# Patient Record
Sex: Female | Born: 2000 | Race: White | Hispanic: No | Marital: Single | Smoking: Former smoker
Health system: Southern US, Community
[De-identification: ages and names within clinical notes are randomized; demographics above are authoritative.]

## PROBLEM LIST (undated history)

## (undated) HISTORY — PX: OTHER SURGICAL HISTORY: SHX169

---
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
## 2020-01-05 ENCOUNTER — Other Ambulatory Visit: Payer: Self-pay

## 2020-01-05 ENCOUNTER — Ambulatory Visit: Payer: Self-pay

## 2020-01-05 ENCOUNTER — Emergency Department
Admission: EM | Admit: 2020-01-05 | Discharge: 2020-01-05 | Disposition: A | Discharge status: Home / Self Care | Payer: Medicaid Other | Attending: Student | Admitting: Student

## 2020-01-05 DIAGNOSIS — Y999 Unspecified external cause status: Secondary | ICD-10-CM | POA: Diagnosis not present

## 2020-01-05 DIAGNOSIS — Z79899 Other long term (current) drug therapy: Secondary | ICD-10-CM | POA: Diagnosis not present

## 2020-01-05 DIAGNOSIS — Y9389 Activity, other specified: Secondary | ICD-10-CM | POA: Insufficient documentation

## 2020-01-05 DIAGNOSIS — S0591XA Unspecified injury of right eye and orbit, initial encounter: Secondary | ICD-10-CM | POA: Diagnosis present

## 2020-01-05 DIAGNOSIS — Y929 Unspecified place or not applicable: Secondary | ICD-10-CM | POA: Insufficient documentation

## 2020-01-05 DIAGNOSIS — H11421 Conjunctival edema, right eye: Secondary | ICD-10-CM | POA: Insufficient documentation

## 2020-01-05 DIAGNOSIS — H1131 Conjunctival hemorrhage, right eye: Secondary | ICD-10-CM | POA: Diagnosis not present

## 2020-01-05 DIAGNOSIS — S0501XA Injury of conjunctiva and corneal abrasion without foreign body, right eye, initial encounter: Secondary | ICD-10-CM

## 2020-01-05 DIAGNOSIS — X58XXXA Exposure to other specified factors, initial encounter: Secondary | ICD-10-CM | POA: Diagnosis not present

## 2020-01-05 MED ORDER — CIPROFLOXACIN HCL 0.3 % OP SOLN: 1.0000 [drp] | OPHTHALMIC | 0 refills | Status: DC

## 2020-01-05 MED ORDER — TETRACAINE HCL 0.5 % OP SOLN
2.0000 [drp] | Freq: Once | OPHTHALMIC | Status: DC
  Filled 2020-01-05: qty 4

## 2020-01-05 MED ORDER — KETOROLAC TROMETHAMINE 0.5 % OP SOLN: 1.0000 [drp] | Freq: Four times a day (QID) | OPHTHALMIC | 0 refills | Status: DC | PRN

## 2020-01-05 MED ORDER — FLUORESCEIN SODIUM 1 MG OP STRP
1.0000 | ORAL_STRIP | Freq: Once | OPHTHALMIC | Status: DC
  Filled 2020-01-05: qty 1

## 2020-01-05 NOTE — ED Triage Notes (Signed)
Patient to ED for complaint of right eye pain. Has worn nonRx contact lenses for 7 months only taking them out a few times. The contact lenses were for looks only not for vision. Patient has no complaint of vision changes but states she feels like her eyes are swollen. Wants to make sure she doesn't have an infection. No obvious redness or swelling noted. Shows a video of eye redness.

## 2020-01-05 NOTE — ED Provider Notes (Signed)
[facility] Emergency Department Provider Note  ____________________________________________  Time seen: Approximately 8:02 PM  I have reviewed the triage vital signs and the nursing notes.   HISTORY  Chief Complaint Eye Pain (Wore contacts for 7 months)    HPI Karen Lucero is a 19 y.o. female who presents the emergency department complaining of right eye pain, redness, possible "swollen" area to the superior aspect of the eyeball.  Patient denies any visual changes or drainage.  Patient states that she was wearing color changing contact lenses.  These were nonprescription.  She states that she wore them almost constantly for several months without removing.  While wearing them she started to have pain to the superior aspect of the eye.  When she remove them she noticed what looked like ruptured blood vessels as well as a "swollen" area to the superior aspect of the eyeball.  No drainage from same.  Patient denies any vision changes.  No other complaints at this time.  Patient does not wear glasses.  She does not need corrective lenses.  Patient was wearing contact lenses to change the color of her eyes.         History reviewed. No pertinent past medical history.  There are no problems to display for this patient.   History reviewed. No pertinent surgical history.  Prior to Admission medications   Medication Sig Start Date End Date Taking? Authorizing Provider  ciprofloxacin (CILOXAN) 0.3 % ophthalmic solution Place 1 drop into the right eye every 2 (two) hours. Administer 1 drop, every 2 hours, while awake, for 2 days. Then 1 drop, every 4 hours, while awake, for the next 5 days. 01/05/20   Navid Lenzen, Charline Bills, PA-C  etonogestrel (NEXPLANON) 68 MG IMPL implant 1 each by Subdermal route once. 11/03/17   Sciora, Real Cons, CNM  ketorolac (ACULAR) 0.5 % ophthalmic solution Place 1 drop into the right eye 4 (four) times daily as needed (eye pain). 01/05/20    Karolina Zamor, Charline Bills, PA-C    Allergies Patient has no known allergies.  Family History  Problem Relation Age of Onset  . Depression Mother   . Autism Sister   . Arthritis Maternal Grandmother   . Depression Maternal Grandmother   . Diabetes Maternal Grandmother   . Hypercholesterolemia Maternal Grandmother     Social History Social History   Tobacco Use  . Smoking status: Never Smoker  . Smokeless tobacco: Never Used  Substance Use Topics  . Alcohol use: Not Currently  . Drug use: Never     Review of Systems  Constitutional: No fever/chills Eyes: No visual changes. No discharge.  Pain, redness, possible swollen area to the right upper eye.  Or contact for 7 months without removing.  Nonprescription contacts ENT: No upper respiratory complaints. Cardiovascular: no chest pain. Respiratory: no cough. No SOB. Gastrointestinal: No abdominal pain.  No nausea, no vomiting.  No diarrhea.  No constipation. Musculoskeletal: Negative for musculoskeletal pain. Skin: Negative for rash, abrasions, lacerations, ecchymosis. Neurological: Negative for headaches, focal weakness or numbness. 10-point ROS otherwise negative.  ____________________________________________   PHYSICAL EXAM:  VITAL SIGNS: ED Triage Vitals  Enc Vitals Group     BP 01/05/20 1946 113/68     Pulse Rate 01/05/20 1946 80     Resp 01/05/20 1946 18     Temp 01/05/20 1946 98.5 F (36.9 C)     Temp Source 01/05/20 1946 Oral     SpO2 01/05/20 1946 99 %  Weight 01/05/20 1948 110 lb (49.9 kg)     Height 01/05/20 1948 5\' 3"  (1.6 m)     Head Circumference --      Peak Flow --      Pain Score 01/05/20 1947 5     Pain Loc --      Pain Edu? --      Excl. in [place]? --      Constitutional: Alert and oriented. Well appearing and in no acute distress. Eyes: Conjunctivae reveals erythema to the superior aspect in the 12 o'clock position on the right eye.  No other conjunctival findings.  Patient does have some  mild subconjunctival hemorrhage in the area of erythema.  No visible foreign body.  PERRL. EOMI. funduscopic exam reveals no visible foreign body.  Red reflex present bilaterally.  Vasculature and optic disc is unremarkable bilaterally.  Eye is anesthetized using tetracaine and fluorescein staining applied.  Area of uptake noted in the 12 o'clock position.  No dendritic pattern is identified. Head: Atraumatic. ENT:      Ears:       Nose: No congestion/rhinnorhea.      Mouth/Throat: Mucous membranes are moist.  Neck: No stridor.    Cardiovascular: Normal rate, regular rhythm. Normal S1 and S2.  Good peripheral circulation. Respiratory: Normal respiratory effort without tachypnea or retractions. Lungs CTAB. Good air entry to the bases with no decreased or absent breath sounds. Musculoskeletal: Full range of motion to all extremities. No gross deformities appreciated. Neurologic:  Normal speech and language. No gross focal neurologic deficits are appreciated.  Skin:  Skin is warm, dry and intact. No rash noted. Psychiatric: Mood and affect are normal. Speech and behavior are normal. Patient exhibits appropriate insight and judgement.   ____________________________________________   LABS (all labs ordered are listed, but only abnormal results are displayed)  Labs Reviewed - No data to display ____________________________________________  EKG   ____________________________________________  RADIOLOGY   No results found.  ____________________________________________    PROCEDURES  Procedure(s) performed:    Procedures    Medications  fluorescein ophthalmic strip 1 strip (has no administration in time range)  tetracaine (PONTOCAINE) 0.5 % ophthalmic solution 2 drop (has no administration in time range)     ____________________________________________   INITIAL IMPRESSION / ASSESSMENT AND PLAN / ED COURSE  Pertinent labs & imaging results that were available during my  care of the patient were reviewed by me and considered in my medical decision making (see chart for details).  Review of the [place] CSRS was performed in accordance of the [facility] prior to dispensing any controlled drugs.           Patient's diagnosis is consistent with corneal abrasion, chemosis, subconjunctival hemorrhage.  Patient presented to the emergency department complaining of right eye pain, redness, possible swelling after using nonprescription contacts.  Patient reports that she was using color change in contacts for aesthetic purposes.  She left the contact lenses and almost consistently for 7 months.  Findings are concerning for corneal abrasion/corneal ulcer with surrounding chemosis and subconjunctival hemorrhage.  I suspect that these were most likely sustained while removing the contacts after leaving them in for too long.  No dendritic pattern.  Given the fact that this occurred after contact where I will place the patient on Cipro eyedrops.  No visual changes at this time.  No indication for emergent ophthalmology referral.  Patient was placed on Cipro eyedrops, recommended to follow-up with ophthalmology..  Patient should not wear contact until  finishing antibiotics.  Patient should discard current contact lenses.  Patient is given ED precautions to return to the ED for any worsening or new symptoms.     ____________________________________________  FINAL CLINICAL IMPRESSION(S) / ED DIAGNOSES  Final diagnoses:  Abrasion of right cornea, initial encounter  Chemosis of right conjunctiva  Subconjunctival hemorrhage of right eye      NEW MEDICATIONS STARTED DURING THIS VISIT:  ED Discharge Orders         Ordered    ciprofloxacin (CILOXAN) 0.3 % ophthalmic solution  Every 2 hours     01/05/20 2029    ketorolac (ACULAR) 0.5 % ophthalmic solution  4 times daily PRN     01/05/20 2029              This chart was dictated using voice recognition software/Dragon. Despite  best efforts to proofread, errors can occur which can change the meaning. Any change was purely unintentional.    Racheal Patches, PA-C 01/05/20 2030    Miguel Aschoff., MD 01/06/20 1227

## 2020-01-08 ENCOUNTER — Emergency Department
Admission: EM | Admit: 2020-01-08 | Discharge: 2020-01-08 | Disposition: A | Discharge status: Home / Self Care | Payer: Medicaid Other | Attending: Student | Admitting: Student

## 2020-01-08 ENCOUNTER — Other Ambulatory Visit: Payer: Self-pay

## 2020-01-08 ENCOUNTER — Ambulatory Visit: Payer: Self-pay

## 2020-01-08 ENCOUNTER — Encounter: Payer: Self-pay | Admitting: Emergency Medicine

## 2020-01-08 DIAGNOSIS — H02826 Cysts of left eye, unspecified eyelid: Secondary | ICD-10-CM

## 2020-01-08 DIAGNOSIS — L723 Sebaceous cyst: Secondary | ICD-10-CM | POA: Diagnosis not present

## 2020-01-08 DIAGNOSIS — Z793 Long term (current) use of hormonal contraceptives: Secondary | ICD-10-CM | POA: Diagnosis not present

## 2020-01-08 DIAGNOSIS — H02824 Cysts of left upper eyelid: Secondary | ICD-10-CM | POA: Diagnosis not present

## 2020-01-08 DIAGNOSIS — H02844 Edema of left upper eyelid: Secondary | ICD-10-CM | POA: Diagnosis present

## 2020-01-08 NOTE — Discharge Instructions (Addendum)
Continue previous medication and follow discharge care instruction.  If redness swelling increased follow-up with ophthalmology.

## 2020-01-08 NOTE — Telephone Encounter (Signed)
Returned call to patient who states that she was being treated via an ER visit for a blue blister rt eye.  She states that now her left eye is swollen and itchy. She states that she notices a bump left side of her face that between eye and ear She states the under eye is swollen and pink in color. She denies fever and states rt eye has improved. Per protocol patient is going to ER for evaluation of her left eye.  She states that her mother is already in route to take her to the ER. Care advice was read to patient She verbalized understanding.  Reason for Disposition . [1] Eyelid (outer) is very red AND [2] fever  Answer Assessment - Initial Assessment Questions 1. ONSET: "When did the swelling start?" (e.g., minutes, hours, days)     Left eye 2. LOCATION: "What part of the eyelids is swollen?"     Upper and lower 3. SEVERITY: "How swollen is it?"    Partial shut 4. ITCHING: "Is there any itching?" If so, ask: "How much?"   (Scale 1-10; mild, moderate or severe)     7 5. PAIN: "Is the swelling painful to touch?" If so, ask: "How painful is it?"   (Scale 1-10; mild, moderate or severe)    no 6. FEVER: "Do you have a fever?" If so, ask: "What is it, how was it measured, and when did it start?"      no 7. CAUSE: "What do you think is causing the swelling?"   unsure 8. RECURRENT SYMPTOM: "Have you had eyelid swelling before?" If so, ask: "When was the last time?" "What happened that time?"     no 9. OTHER SYMPTOMS: "Do you have any other symptoms?" (e.g., blurred vision, eye discharge, rash, runny nose)   Bump left side of face between 10. PREGNANCY: "Is there any chance you are pregnant?" "When was your last menstrual period?"       No birth control no regular periods  Protocols used: EYE - [facility]

## 2020-01-08 NOTE — ED Provider Notes (Signed)
[facility] Emergency Department Provider Note   ____________________________________________   First MD Initiated Contact with Patient 01/08/20 1430     (approximate)  I have reviewed the triage vital signs and the nursing notes.   HISTORY  Chief Complaint Eye Problem    HPI Karen Lucero is a 19 y.o. female patient presents with left upper eyelid edema with full papular lesion.  Patient also has a papular lesion on erythematous base left lateral supraorbital area.  Patient denies vision disturbance.  Patient was seen 3 days ago and diagnosed with corneal abrasion subconjunctival hemorrhaging secondary to prolonged use of cosmetic contact lenses.  Patient is currently being treated with eyedrops.  Patient states right eye pain has improved.  Patient denies left eye pain.  No palliative measure for complaint.         History reviewed. No pertinent past medical history.  There are no problems to display for this patient.   History reviewed. No pertinent surgical history.  Prior to Admission medications   Medication Sig Start Date End Date Taking? Authorizing Provider  ciprofloxacin (CILOXAN) 0.3 % ophthalmic solution Place 1 drop into the right eye every 2 (two) hours. Administer 1 drop, every 2 hours, while awake, for 2 days. Then 1 drop, every 4 hours, while awake, for the next 5 days. 01/05/20   Cuthriell, Charline Bills, PA-C  etonogestrel (NEXPLANON) 68 MG IMPL implant 1 each by Subdermal route once. 11/03/17   Sciora, Real Cons, CNM  ketorolac (ACULAR) 0.5 % ophthalmic solution Place 1 drop into the right eye 4 (four) times daily as needed (eye pain). 01/05/20   Cuthriell, Charline Bills, PA-C    Allergies Patient has no known allergies.  Family History  Problem Relation Age of Onset  . Depression Mother   . Autism Sister   . Arthritis Maternal Grandmother   . Depression Maternal Grandmother   . Diabetes Maternal Grandmother   .  Hypercholesterolemia Maternal Grandmother     Social History Social History   Tobacco Use  . Smoking status: Never Smoker  . Smokeless tobacco: Never Used  Substance Use Topics  . Alcohol use: Not Currently  . Drug use: Never    Review of Systems Constitutional: No fever/chills Eyes: No visual changes. ENT: No sore throat. Cardiovascular: Denies chest pain. Respiratory: Denies shortness of breath. Gastrointestinal: No abdominal pain.  No nausea, no vomiting.  No diarrhea.  No constipation. Genitourinary: Negative for dysuria. Musculoskeletal: Negative for back pain. Skin: Papular lesion left upper eyelid and left supraorbital area. Neurological: Negative for headaches, focal weakness or numbness.   ____________________________________________   PHYSICAL EXAM:  VITAL SIGNS: ED Triage Vitals  Enc Vitals Group     BP 01/08/20 1336 114/75     Pulse Rate 01/08/20 1336 87     Resp 01/08/20 1336 18     Temp 01/08/20 1336 98 F (36.7 C)     Temp Source 01/08/20 1336 Oral     SpO2 01/08/20 1336 99 %     Weight 01/08/20 1339 107 lb (48.5 kg)     Height 01/08/20 1339 5\' 3"  (1.6 m)     Head Circumference --      Peak Flow --      Pain Score 01/08/20 1339 0     Pain Loc --      Pain Edu? --      Excl. in [place]? --    Constitutional: Alert and oriented. Well appearing and in no acute distress.  Eyes: Conjunctivae are normal. PERRL. EOMI. Cardiovascular: Normal rate, regular rhythm. Grossly normal heart sounds.  Good peripheral circulation. Respiratory: Normal respiratory effort.  No retractions. Lungs CTAB. Neurologic:  Normal speech and language. No gross focal neurologic deficits are appreciated. No gait instability. Skin:  Skin is warm, dry and intact.  Papular lesion on erythematous left upper eyelid. Psychiatric: Mood and affect are normal. Speech and behavior are normal.  ____________________________________________   LABS (all labs ordered are listed, but only  abnormal results are displayed)  Labs Reviewed - No data to display ____________________________________________  EKG   ____________________________________________  RADIOLOGY  ED MD interpretation:    Official radiology report(s): No results found.  ____________________________________________   PROCEDURES  Procedure(s) performed (including Critical Care):  Procedures   ____________________________________________   INITIAL IMPRESSION / ASSESSMENT AND PLAN / ED COURSE  As part of my medical decision making, I reviewed the following data within the electronic MEDICAL RECORD NUMBER     Patient presents with mild edema and erythema to the upper eyelid.  Papular lesion consistent with sebaceous cyst on the left upper eyelid.  Patient given discharge care instruction advised continue previous medication.  Patient advised follow ophthalmology if vision disturbance develop.  Follow discharge care instructions.    Nattaly Yebra was evaluated in Emergency Department on 01/08/2020 for the symptoms described in the history of present illness. She was evaluated in the context of the global COVID-19 pandemic, which necessitated consideration that the patient might be at risk for infection with the SARS-CoV-2 virus that causes COVID-19. Institutional protocols and algorithms that pertain to the evaluation of patients at risk for COVID-19 are in a state of rapid change based on information released by regulatory bodies including the CDC and federal and state organizations. These policies and algorithms were followed during the patient's care in the ED.       ____________________________________________   FINAL CLINICAL IMPRESSION(S) / ED DIAGNOSES  Final diagnoses:  Sebaceous cyst of eyelid, left  Inflamed sebaceous cyst     ED Discharge Orders    None       Note:  This document was prepared using Dragon voice recognition software and may include unintentional dictation  errors.    Joni Reining, PA-C 01/08/20 1455    Miguel Aschoff., MD 01/08/20 [phone]

## 2020-01-08 NOTE — ED Triage Notes (Signed)
Patient presents to the ED with pain, swelling and redness to left eye and around left eye.  Patient had redness to her right eye recently and was seen for that but now is having problems with left.  Patient has a small red bump to the left of her left eye as well.

## 2020-01-08 NOTE — ED Notes (Signed)
See triage note  States she was seen about 3 days ago for right eye  States that eye is clearing up  But now has swelling around left eye

## 2020-01-15 ENCOUNTER — Ambulatory Visit: Payer: Self-pay

## 2020-02-02 ENCOUNTER — Telehealth: Payer: Self-pay | Admitting: General Practice

## 2020-02-02 NOTE — Telephone Encounter (Signed)
Returned patient call. Patient states she has cramping, but no bleeding, each month when her period "is due". Sometimes relieved with ibuprofen, but sometimes ibuprofen doesn't help and patient states she is "doubled over in pain". She also states she has lost some weight since Nexplanon insertion, didn't state how much. Nexplanon inserted 11/03/17 at [facility], Last PE was 06/2017. Patient interested in switching to Depo. Patient counseled that both Depo and Nexplanon contain Progesterone, and there is no guarantee that she will gain weight with Depo, or that she will have no cramps. Patient counseled that she can expect 3-6 weeks of irregular bleeding with Depo, and that her Nexplanon is good until December this year. Per provider, patient counseled that she would recommend STD testing due to patient's complaints of cramping/abdominal pain. Per Geralyn Flash, ok to schedule patient for 40 minutes to address issues. Patient scheduled for 02/08/20 and counseled no sex until after appointment (last sex was "a week ago, no condom"). Patient states understanding, and agrees with plan.Burt Knack, RN

## 2020-02-02 NOTE — Telephone Encounter (Signed)
looking to remove nexplanon, consult other options

## 2020-02-08 ENCOUNTER — Other Ambulatory Visit: Payer: Self-pay

## 2020-02-08 ENCOUNTER — Ambulatory Visit: Payer: Medicaid Other | Admitting: Family Medicine

## 2020-02-08 ENCOUNTER — Encounter: Payer: Self-pay | Admitting: Family Medicine

## 2020-02-08 ENCOUNTER — Ambulatory Visit: Payer: Self-pay

## 2020-02-08 ENCOUNTER — Encounter: Payer: Medicaid Other | Admitting: Family Medicine

## 2020-02-08 DIAGNOSIS — Z975 Presence of (intrauterine) contraceptive device: Secondary | ICD-10-CM | POA: Insufficient documentation

## 2020-02-08 DIAGNOSIS — Z113 Encounter for screening for infections with a predominantly sexual mode of transmission: Secondary | ICD-10-CM

## 2020-02-08 LAB — WET PREP FOR TRICH, YEAST, CLUE
Trichomonas Exam: NEGATIVE
Yeast Exam: NEGATIVE

## 2020-02-08 LAB — HM HIV SCREENING LAB: HM HIV Screening: NEGATIVE

## 2020-02-08 NOTE — Progress Notes (Signed)
In desiring STD screening; declines HIV/RPR Sharlette Dense, RN

## 2020-02-08 NOTE — Progress Notes (Addendum)
Wet mount results reviewed. Per standing orders no treatment indicated. ROI signed by patient secondary to requesting TR from today's visit. [employer] results copy given. Tawny Hopping, RN

## 2020-02-08 NOTE — Progress Notes (Signed)
[facility] Department STI clinic/screening visit  Subjective:  Karen Lucero is a 19 y.o. female being seen today for an STI screening visit. The patient reports they do not have symptoms.  Patient reports that they do not desire a pregnancy in the next year.   They reported they are not interested in discussing contraception today.  No LMP recorded. Patient has had an implant.   Patient has the following medical conditions:   Patient Active Problem List   Diagnosis Date Noted  . Nexplanon in place 02/08/2020    Chief Complaint  Patient presents with  . SEXUALLY TRANSMITTED DISEASE    HPI  Patient reports No sx today. Just wants to "make sure everything is ok". Has nto been told by partner about any exposures.   See flowsheet for further details and programmatic requirements.    The following portions of the patient's history were reviewed and updated as appropriate: allergies, current medications, past medical history, past social history, past surgical history and problem list.  Objective:  There were no vitals filed for this visit.  Physical Exam Vitals and nursing note reviewed.  Constitutional:      Appearance: Normal appearance.  HENT:     Head: Normocephalic and atraumatic.     Mouth/Throat:     Mouth: Mucous membranes are moist.     Pharynx: Oropharynx is clear. No oropharyngeal exudate or posterior oropharyngeal erythema.  Pulmonary:     Effort: Pulmonary effort is normal.  Abdominal:     General: Abdomen is flat.     Palpations: There is no mass.     Tenderness: There is no abdominal tenderness. There is no rebound.  Genitourinary:    General: Normal vulva.     Exam position: Lithotomy position.     Pubic Area: No rash or pubic lice.      Labia:        Right: No rash or lesion.        Left: No rash or lesion.      Vagina: Normal. No vaginal discharge, erythema, bleeding or lesions.     Cervix: No cervical motion tenderness, discharge (creamy  white. pH<4.5), friability, lesion or erythema.     Uterus: Normal.      Adnexa: Right adnexa normal and left adnexa normal.     Rectum: Normal.  Lymphadenopathy:     Head:     Right side of head: No preauricular or posterior auricular adenopathy.     Left side of head: No preauricular or posterior auricular adenopathy.     Cervical: No cervical adenopathy.     Upper Body:     Right upper body: No supraclavicular or axillary adenopathy.     Left upper body: No supraclavicular or axillary adenopathy.     Lower Body: No right inguinal adenopathy. No left inguinal adenopathy.  Skin:    General: Skin is warm and dry.     Findings: No rash.  Neurological:     Mental Status: She is alert and oriented to person, place, and time.      Assessment and Plan:  [name] is a 19 y.o. female presenting to the [facility] Department for STI screening  1. Screening examination for venereal disease Patient accepted all screenings including vaginal CT/GC and bloodwork for HIV/RPR.  Patient meets criteria for HepB screening? No. Ordered? No - Not indicated Patient meets criteria for HepC screening? No. Ordered? No - Not indicared  Treat wet prep per standing order  Discussed time line for State Lab results and that patient will be called with positive results and encouraged patient to call if she had not heard in 2 weeks.  Counseled to return or seek care for continued or worsening symptoms Recommended condom use with all sex  Patient is currently using *Nexplanon to prevent pregnancy.    - WET PREP FOR [place], YEAST, CLUE - Chlamydia/Gonorrhea [place] [facility] - HIV [place] [facility] - Syphilis Serology, [place] [facility]  2. Nexplanon in place Placed in 10/2017. Reviewed when it needs removal/replacement. Discussed that there is no increase in pregnancy in a 4th year of use per [employer] project data.      Return if symptoms worsen or fail to improve.  No future appointments.  Caren Macadam, MD

## 2020-03-28 ENCOUNTER — Other Ambulatory Visit: Payer: Self-pay

## 2020-03-28 ENCOUNTER — Emergency Department
Admission: EM | Admit: 2020-03-28 | Discharge: 2020-03-28 | Disposition: A | Discharge status: Home / Self Care | Payer: Medicaid Other | Attending: Emergency Medicine | Admitting: Emergency Medicine

## 2020-03-28 ENCOUNTER — Emergency Department: Payer: Medicaid Other

## 2020-03-28 ENCOUNTER — Encounter: Payer: Self-pay | Admitting: Emergency Medicine

## 2020-03-28 DIAGNOSIS — Y929 Unspecified place or not applicable: Secondary | ICD-10-CM | POA: Diagnosis not present

## 2020-03-28 DIAGNOSIS — X509XXA Other and unspecified overexertion or strenuous movements or postures, initial encounter: Secondary | ICD-10-CM | POA: Diagnosis not present

## 2020-03-28 DIAGNOSIS — Y9349 Activity, other involving dancing and other rhythmic movements: Secondary | ICD-10-CM | POA: Insufficient documentation

## 2020-03-28 DIAGNOSIS — Y999 Unspecified external cause status: Secondary | ICD-10-CM | POA: Diagnosis not present

## 2020-03-28 DIAGNOSIS — S6991XA Unspecified injury of right wrist, hand and finger(s), initial encounter: Secondary | ICD-10-CM | POA: Diagnosis not present

## 2020-03-28 DIAGNOSIS — R591 Generalized enlarged lymph nodes: Secondary | ICD-10-CM | POA: Insufficient documentation

## 2020-03-28 NOTE — Discharge Instructions (Addendum)
The x-ray of your finger is normal.  Please ice and take ibuprofen for pain and swelling.  The masses in your neck feel like lymph nodes.  I recommend that you stay today and have blood work and chest x-ray completed to evaluate for a concerning cause such as cancer.  Please call primary care today or tomorrow for follow-up appointment since you have decided not to stay.

## 2020-03-28 NOTE — ED Triage Notes (Signed)
Pt reports thinks she jammed her right hand middle finger last night doing cartwheels.

## 2020-03-28 NOTE — ED Notes (Signed)
See triage note  Presents with pain to right 3 digit  Thinks she jammed it

## 2020-03-28 NOTE — ED Provider Notes (Signed)
[facility] Emergency Department Provider Note  ____________________________________________  Time seen: Approximately 1:59 PM  I have reviewed the triage vital signs and the nursing notes.   HISTORY  Chief Complaint Finger Injury    HPI Karen Lucero is a 19 y.o. female that presents to emergency department for evaluation of right middle finger after injury last night.  Patient was doing cart wheels last night and jammed it.  No additional injuries.  While patient is here, she wanted to mention that she has these small lumps in her neck.  She states they have been there all of her life.  No recent illness.  No fevers.  History reviewed. No pertinent past medical history.  Patient Active Problem List   Diagnosis Date Noted  . Nexplanon in place 02/08/2020    Past Surgical History:  Procedure Laterality Date  . knee laceration     repaired with skin sutures    Prior to Admission medications   Medication Sig Start Date End Date Taking? Authorizing Provider  etonogestrel (NEXPLANON) 68 MG IMPL implant 1 each by Subdermal route once. 11/03/17   Alberteen Spindle, CNM    Allergies Patient has no known allergies.  Family History  Problem Relation Age of Onset  . Depression Mother   . Autism Sister   . Arthritis Maternal Grandmother   . Depression Maternal Grandmother   . Diabetes Maternal Grandmother   . Hypercholesterolemia Maternal Grandmother     Social History Social History   Tobacco Use  . Smoking status: Never Smoker  . Smokeless tobacco: Never Used  Substance Use Topics  . Alcohol use: Not Currently  . Drug use: Never     Review of Systems  Constitutional: No fever/chills ENT: No upper respiratory complaints. Cardiovascular: No chest pain. Respiratory: No SOB. Gastrointestinal: No abdominal pain.  No nausea, no vomiting.  Musculoskeletal: Positive for finger pain. Skin: Negative for rash, abrasions, lacerations,  ecchymosis. Neurological: Negative for headaches, numbness or tingling   ____________________________________________   PHYSICAL EXAM:  VITAL SIGNS: ED Triage Vitals  Enc Vitals Group     BP 03/28/20 1222 105/61     Pulse Rate 03/28/20 1222 72     Resp 03/28/20 1222 16     Temp 03/28/20 1222 98.4 F (36.9 C)     Temp Source 03/28/20 1222 Oral     SpO2 03/28/20 1222 100 %     Weight 03/28/20 1217 110 lb (49.9 kg)     Height 03/28/20 1217 5\' 3"  (1.6 m)     Head Circumference --      Peak Flow --      Pain Score 03/28/20 1217 7     Pain Loc --      Pain Edu? --      Excl. in GC? --      Constitutional: Alert and oriented. Well appearing and in no acute distress. Eyes: Conjunctivae are normal. PERRL. EOMI. Head: Atraumatic. ENT:      Ears:      Nose: No congestion/rhinnorhea.      Mouth/Throat: Mucous membranes are moist.  Neck: No stridor.  Pea-sized mobile masses to posterior cervical chain. Cardiovascular: Normal rate, regular rhythm.  Good peripheral circulation. Respiratory: Normal respiratory effort without tachypnea or retractions. Lungs CTAB. Good air entry to the bases with no decreased or absent breath sounds. Musculoskeletal: Full range of motion to all extremities. No gross deformities appreciated. Neurologic:  Normal speech and language. No gross focal neurologic deficits are appreciated.  Skin:  Skin is warm, dry and intact. No rash noted. Psychiatric: Mood and affect are normal. Speech and behavior are normal. Patient exhibits appropriate insight and judgement.   ____________________________________________   LABS (all labs ordered are listed, but only abnormal results are displayed)  Labs Reviewed - No data to display ____________________________________________  EKG   ____________________________________________  RADIOLOGY Robinette Haines, personally viewed and evaluated these images (plain radiographs) as part of my medical decision making,  as well as reviewing the written report by the radiologist.  DG Finger Middle Right  Result Date: 03/28/2020 CLINICAL DATA:  Right long finger pain EXAM: RIGHT MIDDLE FINGER 2+V COMPARISON:  None. FINDINGS: There is no evidence of fracture or dislocation. There is no evidence of arthropathy or other focal bone abnormality. Soft tissues are unremarkable. IMPRESSION: Negative. Electronically Signed   By: Davina Poke D.O.   On: 03/28/2020 12:59    ____________________________________________    PROCEDURES  Procedure(s) performed:    Procedures    Medications - No data to display   ____________________________________________   INITIAL IMPRESSION / ASSESSMENT AND PLAN / ED COURSE  Pertinent labs & imaging results that were available during my care of the patient were reviewed by me and considered in my medical decision making (see chart for details).  Review of the [place] CSRS was performed in accordance of the [place] prior to dispensing any controlled drugs.   Patient presented to emergency department for evaluation of finger injury.  Vital signs and exam are reassuring.  X-ray negative for acute bony abnormalities.  Patient has palpable cervical lymphadenopathy.  Patient declines any recent illness.  Patient declines lab work and chest x-ray in the emergency department to further evaluate.  Patient states that she does not have time for this today and will follow up with primary care.  Patient is to follow up with primary care as directed. Patient is given ED precautions to return to the ED for any worsening or new symptoms.   Karen Lucero was evaluated in Emergency Department on 03/28/2020 for the symptoms described in the history of present illness. She was evaluated in the context of the global COVID-19 pandemic, which necessitated consideration that the patient might be at risk for infection with the SARS-CoV-2 virus that causes COVID-19. Institutional protocols and algorithms that  pertain to the evaluation of patients at risk for COVID-19 are in a state of rapid change based on information released by regulatory bodies including the CDC and federal and state organizations. These policies and algorithms were followed during the patient's care in the ED.  ____________________________________________  FINAL CLINICAL IMPRESSION(S) / ED DIAGNOSES  Final diagnoses:  Injury of finger of right hand, initial encounter  Lymphadenopathy of head and neck      NEW MEDICATIONS STARTED DURING THIS VISIT:  ED Discharge Orders    None          This chart was dictated using voice recognition software/Dragon. Despite best efforts to proofread, errors can occur which can change the meaning. Any change was purely unintentional.    Laban Emperor, PA-C 03/28/20 1535    Lavonia Drafts, MD 03/29/20 [id number]

## 2020-09-30 ENCOUNTER — Ambulatory Visit: Payer: Medicaid Other

## 2020-09-30 ENCOUNTER — Encounter: Payer: Self-pay | Admitting: Advanced Practice Midwife

## 2020-09-30 ENCOUNTER — Ambulatory Visit: Payer: Medicaid Other | Admitting: Family Medicine

## 2020-09-30 ENCOUNTER — Other Ambulatory Visit: Payer: Self-pay

## 2020-09-30 DIAGNOSIS — Z113 Encounter for screening for infections with a predominantly sexual mode of transmission: Secondary | ICD-10-CM

## 2020-09-30 DIAGNOSIS — A749 Chlamydial infection, unspecified: Secondary | ICD-10-CM

## 2020-09-30 LAB — WET PREP FOR TRICH, YEAST, CLUE
Trichomonas Exam: NEGATIVE
Yeast Exam: NEGATIVE

## 2020-09-30 MED ORDER — AZITHROMYCIN 500 MG PO TABS
1000.0000 mg | ORAL_TABLET | Freq: Once | ORAL | Status: AC
Start: 2020-09-30 — End: 2020-09-30
  Administered 2020-09-30: 1000 mg via ORAL

## 2020-09-30 NOTE — Progress Notes (Addendum)
[facility] Department STI clinic/screening visit  Subjective:  Karen Lucero is a 19 y.o. female being seen today for an STI screening visit. The patient reports they do have symptoms.  Patient reports that they do not desire a pregnancy in the next year.   They reported they are not interested in discussing contraception today.  Patient's last menstrual period was 09/20/2020 (approximate).   Patient has the following medical conditions:   Patient Active Problem List   Diagnosis Date Noted  . Nexplanon in place 02/08/2020    Chief Complaint  Patient presents with  . Exposure to STD    HPI  Patient reports having symptoms of dysuria and discharge that is pink tinged when wiping.  Reports some pressure in low abdomen x 2 days but no longer having.  Patient thought was having UTI so started drinking cranberry juice, may pain with urination better but still there.   Denies any other questions or concerns.   Last HIV test per patient/review of record was 02/08/2020 Patient has never had pap due to age.   See flowsheet for further details and programmatic requirements.    The following portions of the patient's history were reviewed and updated as appropriate: allergies, current medications, past medical history, past social history, past surgical history and problem list.  Objective:  There were no vitals filed for this visit.  Physical Exam Vitals and nursing note reviewed.  Constitutional:      Appearance: Normal appearance.  HENT:     Head: Normocephalic and atraumatic.     Comments: In scalp, brows and lashes: no nits, no hair loss     Mouth/Throat:     Mouth: Mucous membranes are moist.     Pharynx: Oropharynx is clear. No oropharyngeal exudate or posterior oropharyngeal erythema.  Pulmonary:     Effort: Pulmonary effort is normal.  Abdominal:     General: Abdomen is flat.     Palpations: Abdomen is soft. There is no mass.     Tenderness: There is no  abdominal tenderness. There is no rebound.  Genitourinary:    General: Normal vulva.     Exam position: Lithotomy position.     Pubic Area: No rash or pubic lice.      Labia:        Right: No rash or lesion.        Left: No rash or lesion.      Vagina: Normal. No vaginal discharge, erythema, bleeding or lesions.     Cervix: No cervical motion tenderness, discharge, friability, lesion or erythema.     Uterus: Normal.      Adnexa: Right adnexa normal and left adnexa normal.     Rectum: Normal.     Comments: External genitalia without, lice, nits, erythema, edema , lesions or inguinal adenopathy. Vagina with normal mucosa and discharge and pH >4.  Cervix without visual lesions, uterus firm, mobile, non-tender, no masses, CMT adnexal fullness or tenderness.  Musculoskeletal:     Cervical back: Normal range of motion.  Lymphadenopathy:     Head:     Right side of head: No preauricular or posterior auricular adenopathy.     Left side of head: No preauricular or posterior auricular adenopathy.     Cervical: No cervical adenopathy.     Upper Body:     Right upper body: No supraclavicular or axillary adenopathy.     Left upper body: No supraclavicular or axillary adenopathy.     Lower Body: No  right inguinal adenopathy. No left inguinal adenopathy.  Skin:    General: Skin is warm and dry.     Findings: No rash.  Neurological:     Mental Status: She is alert and oriented to person, place, and time.      Assessment and Plan:  Karen Lucero is a 19 y.o. female presenting to the [facility] Department for STI screening  1. Screening examination for venereal disease *- Chlamydia/Gonorrhea [facility] - HIV [facility] - Syphilis Serology, [facility] - WET PREP FOR TRICH, YEAST, CLUE  Patient accepted all screenings including ,wet prep,  vaginal CT/GC and bloodwork for HIV/RPR.  Patient meets criteria for HepB screening? No. Ordered? No - does not meet criteria  Patient  meets criteria for HepC screening? No. Ordered? No - does not meet criteria   Wet prep results positive clue, neg amine  Treatment needed for presumptive  chlamydia  With 1 gm Azithromycin PO DOT.    Discussed time line for [facility] results and that patient will be called with positive results and encouraged patient to call if she had not heard in 2 weeks.   Counseled to return or seek care for continued or worsening symptoms No sex for 7 days.  Recommended condom use with all sex.  Please give latex free condoms   Patient is currently using *Nexplanon to prevent pregnancy.     Return if symptoms worsen or fail to improve.  No future appointments.  Wendi Snipes, FNP

## 2020-09-30 NOTE — Progress Notes (Signed)
Presents for STD screen. Reports symptoms today. Results reviewed with provider- treated for presumptive chlamydia per standing order. Pt tolerated medication well. Instructed to call clinic is vomits within 2 hours of medication. Non-latex condoms given. Sharlyne Pacas, RN

## 2020-11-15 ENCOUNTER — Emergency Department: Payer: Medicaid Other

## 2020-11-15 ENCOUNTER — Other Ambulatory Visit: Payer: Self-pay

## 2020-11-15 ENCOUNTER — Emergency Department
Admission: EM | Admit: 2020-11-15 | Discharge: 2020-11-15 | Disposition: A | Discharge status: Home / Self Care | Payer: Medicaid Other | Attending: Emergency Medicine | Admitting: Emergency Medicine

## 2020-11-15 DIAGNOSIS — U071 COVID-19: Secondary | ICD-10-CM | POA: Diagnosis not present

## 2020-11-15 DIAGNOSIS — J069 Acute upper respiratory infection, unspecified: Secondary | ICD-10-CM | POA: Diagnosis not present

## 2020-11-15 DIAGNOSIS — R519 Headache, unspecified: Secondary | ICD-10-CM

## 2020-11-15 LAB — POC URINE PREG, ED: Preg Test, Ur: NEGATIVE

## 2020-11-15 LAB — CBC
HCT: 37.2 % (ref 36.0–46.0)
Hemoglobin: 12.5 g/dL (ref 12.0–15.0)
MCH: 26.2 pg (ref 26.0–34.0)
MCHC: 33.6 g/dL (ref 30.0–36.0)
MCV: 78 fL — ABNORMAL LOW (ref 80.0–100.0)
Platelets: 203 10*3/uL (ref 150–400)
RBC: 4.77 MIL/uL (ref 3.87–5.11)
RDW: 12.9 % (ref 11.5–15.5)
WBC: 5.6 10*3/uL (ref 4.0–10.5)
nRBC: 0 % (ref 0.0–0.2)

## 2020-11-15 LAB — COMPREHENSIVE METABOLIC PANEL
ALT: 15 U/L (ref 0–44)
AST: 18 U/L (ref 15–41)
Albumin: 4.5 g/dL (ref 3.5–5.0)
Alkaline Phosphatase: 53 U/L (ref 38–126)
Anion gap: 11 (ref 5–15)
BUN: 7 mg/dL (ref 6–20)
CO2: 23 mmol/L (ref 22–32)
Calcium: 9.2 mg/dL (ref 8.9–10.3)
Chloride: 101 mmol/L (ref 98–111)
Creatinine, Ser: 0.76 mg/dL (ref 0.44–1.00)
GFR, Estimated: >60 mL/min (ref >60)
Glucose, Bld: 111 mg/dL — ABNORMAL HIGH (ref 70–99)
Potassium: 3.3 mmol/L — ABNORMAL LOW (ref 3.5–5.1)
Sodium: 135 mmol/L (ref 135–145)
Total Bilirubin: 0.5 mg/dL (ref 0.3–1.2)
Total Protein: 7.9 g/dL (ref 6.5–8.1)

## 2020-11-15 LAB — URINALYSIS, COMPLETE (UACMP) WITH MICROSCOPIC
Bacteria, UA: NONE SEEN
Bilirubin Urine: NEGATIVE
Glucose, UA: NEGATIVE mg/dL
Hgb urine dipstick: NEGATIVE
Ketones, ur: 20 mg/dL — AB
Leukocytes,Ua: NEGATIVE
Nitrite: NEGATIVE
Protein, ur: NEGATIVE mg/dL
Specific Gravity, Urine: 1.024 (ref 1.005–1.030)
pH: 5 (ref 5.0–8.0)

## 2020-11-15 LAB — RESP PANEL BY RT-PCR (FLU A&B, COVID) ARPGX2
Influenza A by PCR: NEGATIVE
Influenza B by PCR: NEGATIVE
SARS Coronavirus 2 by RT PCR: POSITIVE — AB

## 2020-11-15 MED ORDER — ACETAMINOPHEN 325 MG PO TABS
650.0000 mg | ORAL_TABLET | Freq: Once | ORAL | Status: DC | PRN
  Filled 2020-11-15: qty 2

## 2020-11-15 NOTE — ED Triage Notes (Signed)
PT to ED c/o nausea, headache, fever and mucous in trhoat. SX bad for 2 days. No known covid exposure. No diarrhea, positive for cough.

## 2020-11-15 NOTE — ED Provider Notes (Signed)
[facility] Emergency Department Provider Note   ____________________________________________   Event Date/Time   First MD Initiated Contact with Patient 11/15/20 1657     (approximate)  I have reviewed the triage vital signs and the nursing notes.   HISTORY  Chief Complaint Fever and Headache    HPI Karen Lucero is a 19 y.o. female with no significant past medical history who presents to the ED complaining of fever.  Patient reports that she started feeling hot yesterday with nonproductive cough, congestion, and headache.  She started feeling more weak and malaise today, took her temperature at that time and found it to be elevated.  She is not aware of any sick contacts, has not been vaccinated against COVID-19.  She denies any abdominal pain, nausea, vomiting, diarrhea, dysuria, or hematuria.        History reviewed. No pertinent past medical history.  Patient Active Problem List   Diagnosis Date Noted  . Nexplanon in place 02/08/2020    Past Surgical History:  Procedure Laterality Date  . knee laceration     repaired with skin sutures    Prior to Admission medications   Medication Sig Start Date End Date Taking? Authorizing Provider  etonogestrel (NEXPLANON) 68 MG IMPL implant 1 each by Subdermal route once. 11/03/17   Alberteen Spindle, CNM    Allergies Patient has no known allergies.  Family History  Problem Relation Age of Onset  . Depression Mother   . Autism Sister   . Arthritis Maternal Grandmother   . Depression Maternal Grandmother   . Diabetes Maternal Grandmother   . Hypercholesterolemia Maternal Grandmother     Social History Social History   Tobacco Use  . Smoking status: Never Smoker  . Smokeless tobacco: Never Used  Vaping Use  . Vaping Use: Every day  . Start date: 11/30/2018  . Substances: Nicotine, Flavoring  Substance Use Topics  . Alcohol use: Not Currently    Comment: "sometimes not really"   . Drug  use: Never    Review of Systems  Constitutional: Positive for fevers and malaise. Eyes: No visual changes. ENT: No sore throat.  Positive for congestion. Cardiovascular: Denies chest pain. Respiratory: Denies shortness of breath.  Positive for cough. Gastrointestinal: No abdominal pain.  No nausea, no vomiting.  No diarrhea.  No constipation. Genitourinary: Negative for dysuria. Musculoskeletal: Negative for back pain. Skin: Negative for rash. Neurological: Negative for headaches, focal weakness or numbness.  ____________________________________________   PHYSICAL EXAM:  VITAL SIGNS: ED Triage Vitals  Enc Vitals Group     BP 11/15/20 1614 102/63     Pulse Rate 11/15/20 1614 (!) 120     Resp --      Temp 11/15/20 1614 (!) 101.4 F (38.6 C)     Temp Source 11/15/20 1614 Oral     SpO2 11/15/20 1614 100 %     Weight 11/15/20 1614 114 lb (51.7 kg)     Height 11/15/20 1614 5\' 4"  (1.626 m)     Head Circumference --      Peak Flow --      Pain Score 11/15/20 1619 10     Pain Loc --      Pain Edu? --      Excl. in GC? --     Constitutional: Alert and oriented. Eyes: Conjunctivae are normal. Head: Atraumatic. Nose: No congestion/rhinnorhea. Mouth/Throat: Mucous membranes are moist. Neck: Normal ROM, no stiffness or meningismus noted. Cardiovascular: Normal rate, regular rhythm. Grossly  normal heart sounds. Respiratory: Normal respiratory effort.  No retractions. Lungs CTAB. Gastrointestinal: Soft and nontender. No distention. Genitourinary: deferred Musculoskeletal: No lower extremity tenderness nor edema. Neurologic:  Normal speech and language. No gross focal neurologic deficits are appreciated. Skin:  Skin is warm, dry and intact. No rash noted. Psychiatric: Mood and affect are normal. Speech and behavior are normal.  ____________________________________________   LABS (all labs ordered are listed, but only abnormal results are displayed)  Labs Reviewed  RESP  PANEL BY RT-PCR (FLU A&B, COVID) ARPGX2 - Abnormal; Notable for the following components:      Result Value   SARS Coronavirus 2 by RT PCR POSITIVE (*)    All other components within normal limits  COMPREHENSIVE METABOLIC PANEL - Abnormal; Notable for the following components:   Potassium 3.3 (*)    Glucose, Bld 111 (*)    All other components within normal limits  CBC - Abnormal; Notable for the following components:   MCV 78.0 (*)    All other components within normal limits  URINALYSIS, COMPLETE (UACMP) WITH MICROSCOPIC - Abnormal; Notable for the following components:   Color, Urine YELLOW (*)    APPearance HAZY (*)    Ketones, ur 20 (*)    All other components within normal limits  POC URINE PREG, ED    PROCEDURES  Procedure(s) performed (including Critical Care):  Procedures   ____________________________________________   INITIAL IMPRESSION / ASSESSMENT AND PLAN / ED COURSE       19 year old female with no significant past medical history presents to the ED complaining of fever, malaise, cough, and congestion since yesterday.  Patient noted to be febrile and tachycardic on arrival but is overall well-appearing and I do not suspect sepsis.  Lab work thus far is reassuring, no leukocytosis noted and electrolytes within normal limits.  Fever was treated with Tylenol, UA shows no evidence of infection.  Chest x-ray reviewed by me and shows no infiltrate, edema, or effusion.  I suspect viral respiratory illness at this time and we will perform testing for COVID-19 as well as influenza.  Otherwise, patient is appropriate for discharge home with PCP follow-up.  She was counseled to return to the ED for new worsening symptoms, patient agrees with plan.      ____________________________________________   FINAL CLINICAL IMPRESSION(S) / ED DIAGNOSES  Final diagnoses:  Upper respiratory tract infection, unspecified type  Acute nonintractable headache, unspecified headache type      ED Discharge Orders    None       Note:  This document was prepared using Dragon voice recognition software and may include unintentional dictation errors.   Chesley Noon, MD 11/15/20 2326

## 2020-11-17 ENCOUNTER — Telehealth (HOSPITAL_COMMUNITY): Payer: Self-pay | Admitting: Family

## 2020-11-17 NOTE — Telephone Encounter (Signed)
Called to discuss with Jori Moll about Covid symptoms and potential candidacy for the use of sotrovimab, a combination monoclonal antibody infusion for those with mild to moderate Covid symptoms and at a high risk of hospitalization.     Pt is qualified for this infusion at the infusion center due to co-morbid conditions and/or a member of an at-risk group as she appears to be SVI 4 and unvaccinated, and meets current clinic criteria, however unable to reach patient. VM left.  Kalil Woessner,NP

## 2020-11-23 NOTE — L&D Delivery Note (Signed)
Delivery Note  First Stage: Labor onset: 1100 w/ SROM Augmentation : Cytotec, Pitocin Analgesia /Anesthesia intrapartum: stadol, epidural SROM at 1100  Second Stage: Complete dilation at 1756 Onset of pushing at 1800 FHR second stage Cat II- variable decels  Delivery of a viable female on 09/09/21 at 1828 by CNM delivery of fetal head in OA position with restitution to LOA. Loose shoulder cord;  Anterior then posterior shoulders delivered easily with gentle downward traction. Baby placed on mom's chest, and attended to by peds.  Cord double clamped after cessation of pulsation, cut by FOB. Cord blood sample collected    Third Stage: Placenta delivered spontaneously intact with 3VC @ 1833 Placenta disposition: routine disposal Uterine tone Firm / bleeding small  Hymenal ring abrasions noted laceration identified  Anesthesia for repair: n/a Repair n/a Est. Blood Loss (mL):  Complications: none  Mom to postpartum.  Baby to Couplet care / Skin to Skin.  Newborn: Birth Weight: 6#5 Apgar Scores: 8/9 Feeding planned: breast

## 2020-11-28 ENCOUNTER — Ambulatory Visit: Payer: Medicaid Other

## 2020-12-04 ENCOUNTER — Encounter: Payer: Self-pay | Admitting: Physician Assistant

## 2020-12-04 ENCOUNTER — Ambulatory Visit (LOCAL_COMMUNITY_HEALTH_CENTER): Payer: Medicaid Other | Admitting: Family Medicine

## 2020-12-04 ENCOUNTER — Ambulatory Visit: Payer: Medicaid Other

## 2020-12-04 ENCOUNTER — Other Ambulatory Visit: Payer: Self-pay

## 2020-12-04 VITALS — BP 106/72 | Ht 64.0 in | Wt 108.6 lb

## 2020-12-04 DIAGNOSIS — Z3046 Encounter for surveillance of implantable subdermal contraceptive: Secondary | ICD-10-CM

## 2020-12-04 DIAGNOSIS — Z0000 Encounter for general adult medical examination without abnormal findings: Secondary | ICD-10-CM | POA: Diagnosis not present

## 2020-12-04 DIAGNOSIS — Z3009 Encounter for other general counseling and advice on contraception: Secondary | ICD-10-CM

## 2020-12-04 NOTE — Progress Notes (Signed)
Pt to clinic for physical and nexplanon removal. Plans to use condoms for birth control until ready to start trying to get pregnant.

## 2020-12-04 NOTE — Progress Notes (Signed)
[facility] DEPARTMENT [facility] [address] Main Number: [phone]    Family Planning Visit- Initial Visit  Subjective:  Karen Lucero is a 20 y.o.  G0P0000   being seen today for an initial well woman visit and to discuss family planning options.  She is currently using Nexplanon for pregnancy prevention. Patient reports she does want a pregnancy in the next year.  Patient has the following medical conditions has Nexplanon in place on their problem list.  Chief Complaint  Patient presents with  . Gynecologic Exam  . Contraception    Nexplanon removal, desires pregnancy    Patient reports here for physical and removal of BCM   Patient denies any s/sx, problems or concerns.    Body mass index is 18.64 kg/m. - Patient is eligible for diabetes screening based on BMI and age >43?  not applicable HA1C ordered? not applicable  Patient reports 2  partner/s in last year. Desires STI screening?  No - patient declined   Has patient been screened once for HCV in the past?  No  No results found for: HCVAB  Does the patient have current drug use (including MJ), have a partner with drug use, and/or has been incarcerated since last result? Yes  If yes-- Screen for HCV through [facility]   Does the patient meet criteria for HBV testing? Yes  Patient declined.   Criteria:  -Household, sexual or needle sharing contact with HBV -History of drug use -HIV positive -Those with known Hep C   Health Maintenance Due  Topic Date Due  . Hepatitis C Screening  Never done  . CHLAMYDIA SCREENING  Never done  . INFLUENZA VACCINE  Never done  . TETANUS/TDAP  Never done    Review of Systems  Constitutional: Negative for chills, fever, malaise/fatigue and weight loss.  HENT: Negative for congestion, hearing loss and sore throat.   Eyes: Negative for blurred vision, double vision and photophobia.  Respiratory: Negative for shortness of breath.    Cardiovascular: Negative for chest pain.  Gastrointestinal: Negative for abdominal pain, blood in stool, constipation, diarrhea, heartburn, nausea and vomiting.  Genitourinary: Negative for dysuria and frequency.  Musculoskeletal: Negative for back pain, joint pain and neck pain.  Skin: Negative for itching and rash.  Neurological: Negative for dizziness, weakness and headaches.  Endo/Heme/Allergies: Does not bruise/bleed easily.  Psychiatric/Behavioral: Negative for depression, substance abuse and suicidal ideas.  All other systems reviewed and are negative.   The following portions of the patient's history were reviewed and updated as appropriate: allergies, current medications, past family history, past medical history, past social history, past surgical history and problem list. Problem list updated.   See flowsheet for other program required questions.  Objective:   Vitals:   12/04/20 1447  BP: 106/72  Weight: 108 lb 9.6 oz (49.3 kg)  Height: 5\' 4"  (1.626 m)    Physical Exam Vitals and nursing note reviewed.  Constitutional:      Appearance: Normal appearance.  HENT:     Head: Normocephalic and atraumatic.     Mouth/Throat:     Mouth: Mucous membranes are moist.     Pharynx: No oropharyngeal exudate or posterior oropharyngeal erythema.  Eyes:     General: No scleral icterus. Cardiovascular:     Rate and Rhythm: Normal rate and regular rhythm.     Pulses: Normal pulses.     Heart sounds: Normal heart sounds.  Pulmonary:     Effort: Pulmonary  effort is normal.     Breath sounds: Normal breath sounds.  Abdominal:     General: Abdomen is flat. Bowel sounds are normal.     Palpations: Abdomen is soft.  Genitourinary:    Comments: Deferred  Musculoskeletal:        General: Normal range of motion.     Cervical back: Normal range of motion and neck supple.  Skin:    General: Skin is warm and dry.  Neurological:     General: No focal deficit present.     Mental  Status: She is alert and oriented to person, place, and time.  Psychiatric:        Mood and Affect: Mood normal.        Behavior: Behavior normal.       Assessment and Plan:  Karen Lucero is a 20 y.o. female presenting to the [facility] Department for an initial well woman exam/family planning visit.   1. Routine general medical examination at a health care facility Patient her for well woman exam,  Not due for PAP or CBE.  Discussed preventive screenings and when due.     2. Family planning counseling Contraception counseling: Reviewed all forms of birth control options in the tiered based approach. available including abstinence; over the counter/barrier methods; hormonal contraceptive medication including pill, patch, ring, injection,contraceptive implant, ECP; hormonal and nonhormonal IUDs; permanent sterilization options including vasectomy and the various tubal sterilization modalities. Risks, benefits, and typical effectiveness rates were reviewed.   Patient desires removal of nexplanon for  pregnancy.  She was told to call with any further questions, or with any concerns about pregnancy.  Emphasized use of condoms 100% of the time for STI prevention.  Patient was not offered ECP based on last  Sexual encounter and desire for pregnancy.     3. Nexplanon removal Patient identified, informed consent performed, consent signed.   Appropriate time out taken. Nexplanon site identified.  Area prepped in usual sterile fashon. 3 ml of 1% lidocaine with Epinephrine was used to anesthetize the area at the distal end of the implant and along implant site. A small stab incision was made right beside the implant on the distal portion.  The Nexplanon rod was grasped using manual and removed without difficulty.  There was minimal blood loss. There were no complications.  Steri-strips were applied over the small incision.  A pressure bandage was applied to reduce any bruising.  The patient  tolerated the procedure well and was given post procedure instructions.    Counseled patient to take OTC analgesic starting as soon as lidocaine starts to wear off and take regularly for at least 48 hr to decrease discomfort.  Specifically to take with food or milk to decrease stomach upset and for IB 600 mg (3 tablets) every 6 hrs; IB 800 mg (4 tablets) every 8 hrs; or Aleve 2 tablets every 12 hrs.    Return in about 1 year (around 12/04/2021) for annual well woman exam.  No future appointments.  Wendi Snipes, FNP

## 2021-01-08 ENCOUNTER — Emergency Department
Admission: EM | Admit: 2021-01-08 | Discharge: 2021-01-08 | Disposition: A | Discharge status: Home / Self Care | Payer: Medicaid Other | Attending: Emergency Medicine | Admitting: Emergency Medicine

## 2021-01-08 ENCOUNTER — Other Ambulatory Visit: Payer: Self-pay

## 2021-01-08 ENCOUNTER — Encounter: Payer: Self-pay | Admitting: Emergency Medicine

## 2021-01-08 DIAGNOSIS — O99711 Diseases of the skin and subcutaneous tissue complicating pregnancy, first trimester: Secondary | ICD-10-CM | POA: Diagnosis present

## 2021-01-08 DIAGNOSIS — Z3A01 Less than 8 weeks gestation of pregnancy: Secondary | ICD-10-CM | POA: Insufficient documentation

## 2021-01-08 DIAGNOSIS — L509 Urticaria, unspecified: Secondary | ICD-10-CM | POA: Insufficient documentation

## 2021-01-08 LAB — CBC WITH DIFFERENTIAL/PLATELET
Abs Immature Granulocytes: 0.02 10*3/uL (ref 0.00–0.07)
Basophils Absolute: 0 10*3/uL (ref 0.0–0.1)
Basophils Relative: 0 %
Eosinophils Absolute: 0.1 10*3/uL (ref 0.0–0.5)
Eosinophils Relative: 2 %
HCT: 37.1 % (ref 36.0–46.0)
Hemoglobin: 12.5 g/dL (ref 12.0–15.0)
Immature Granulocytes: 0 %
Lymphocytes Relative: 25 %
Lymphs Abs: 1.8 10*3/uL (ref 0.7–4.0)
MCH: 26.3 pg (ref 26.0–34.0)
MCHC: 33.7 g/dL (ref 30.0–36.0)
MCV: 78.1 fL — ABNORMAL LOW (ref 80.0–100.0)
Monocytes Absolute: 0.5 10*3/uL (ref 0.1–1.0)
Monocytes Relative: 7 %
Neutro Abs: 4.9 10*3/uL (ref 1.7–7.7)
Neutrophils Relative %: 66 %
Platelets: 260 10*3/uL (ref 150–400)
RBC: 4.75 MIL/uL (ref 3.87–5.11)
RDW: 13.3 % (ref 11.5–15.5)
WBC: 7.3 10*3/uL (ref 4.0–10.5)
nRBC: 0 % (ref 0.0–0.2)

## 2021-01-08 LAB — COMPREHENSIVE METABOLIC PANEL
ALT: 11 U/L (ref 0–44)
AST: 18 U/L (ref 15–41)
Albumin: 4 g/dL (ref 3.5–5.0)
Alkaline Phosphatase: 47 U/L (ref 38–126)
Anion gap: 7 (ref 5–15)
BUN: 14 mg/dL (ref 6–20)
CO2: 26 mmol/L (ref 22–32)
Calcium: 9.2 mg/dL (ref 8.9–10.3)
Chloride: 104 mmol/L (ref 98–111)
Creatinine, Ser: 0.7 mg/dL (ref 0.44–1.00)
GFR, Estimated: >60 mL/min (ref >60)
Glucose, Bld: 86 mg/dL (ref 70–99)
Potassium: 3.9 mmol/L (ref 3.5–5.1)
Sodium: 137 mmol/L (ref 135–145)
Total Bilirubin: 0.8 mg/dL (ref 0.3–1.2)
Total Protein: 7.2 g/dL (ref 6.5–8.1)

## 2021-01-08 LAB — HCG, QUANTITATIVE, PREGNANCY: hCG, Beta Chain, Quant, S: 778 m[IU]/mL — ABNORMAL HIGH (ref <5)

## 2021-01-08 LAB — TSH: TSH: 1.175 u[IU]/mL (ref 0.350–4.500)

## 2021-01-08 MED ORDER — DIPHENHYDRAMINE HCL 25 MG PO CAPS
50.0000 mg | ORAL_CAPSULE | Freq: Once | ORAL | Status: AC
  Administered 2021-01-08: 50 mg via ORAL
  Filled 2021-01-08: qty 2

## 2021-01-08 MED ORDER — FAMOTIDINE 20 MG PO TABS
20.0000 mg | ORAL_TABLET | Freq: Once | ORAL | Status: AC
  Administered 2021-01-08: 20 mg via ORAL
  Filled 2021-01-08: qty 1

## 2021-01-08 NOTE — ED Notes (Signed)
Pt alert and oriented X 4, stable for discharge. RR even and unlabored, color WNL. Discussed discharge instructions and follow-up as directed. Discharge medications discussed if prescribed. Pt had opportunity to ask questions, and RN to provide patient/family eduction.  

## 2021-01-08 NOTE — ED Triage Notes (Signed)
Pt comes into the ED via POV c/o rash that is generalized on the body. Pt recently got a tattoo on her back but no infection near the tattoo.  Pt also recently found out she is about 5-[redacted] weeks pregnant.  Pt in NAD at this time.

## 2021-01-08 NOTE — Discharge Instructions (Addendum)
You may take Benadryl, 50 mg twice daily and Pepcid, 20 mg twice daily as needed.  In addition, you may take Zyrtec once daily for allergy and interleaved.  Please follow-up with your OB/GYN regarding your quantitative hCG or return to the emergency department with any vaginal bleeding, abdominal pain or other symptoms.

## 2021-01-09 NOTE — ED Provider Notes (Signed)
[facility] Emergency Department Provider Note  ____________________________________________   Event Date/Time   First MD Initiated Contact with Patient 01/08/21 1606     (approximate)  I have reviewed the triage vital signs and the nursing notes.   HISTORY  Chief Complaint Rash  HPI Karen Lucero is a 20 y.o. female who reports to the emergency department for evaluation of itchy rash that began this morning.  Patient states the rash is generalized.  She did recently get a tattoo on her back on Friday but notes no infection to the area and has had previous work by the same tattoo artist/materials.  She denies any other new logic detergents, soaps, lotions or other chemical exposure.  Patient states that she recently found out that she is roughly 5 to [redacted] weeks pregnant.  States this was confirmed by urine pregnancy test at urgent care on Saturday.  Of note, patient's last menstrual period was in first part of January, however patient had Nexplanon removed just after menstruation started, possibly changing potential dating.  She denies any swelling of the lips, throat, denies trouble breathing or speaking.  Urticaria seems to be intermittent and most prominent over the hands and abdomen.         History reviewed. No pertinent past medical history.  Patient Active Problem List   Diagnosis Date Noted  . Nexplanon in place 02/08/2020    Past Surgical History:  Procedure Laterality Date  . knee laceration     repaired with skin sutures    Prior to Admission medications   Medication Sig Start Date End Date Taking? Authorizing Provider  etonogestrel (NEXPLANON) 68 MG IMPL implant 1 each by Subdermal route once. 11/03/17   Alberteen Spindle, CNM    Allergies Patient has no known allergies.  Family History  Problem Relation Age of Onset  . Depression Mother   . Autism Sister   . Arthritis Maternal Grandmother   . Depression Maternal Grandmother   .  Diabetes Maternal Grandmother   . Hypercholesterolemia Maternal Grandmother   . Depression Maternal Grandfather     Social History Social History   Tobacco Use  . Smoking status: Never Smoker  . Smokeless tobacco: Never Used  . Tobacco comment: vapese  Vaping Use  . Vaping Use: Every day  . Start date: 11/30/2018  . Substances: Nicotine, Flavoring  Substance Use Topics  . Alcohol use: Not Currently  . Drug use: Not Currently    Types: Marijuana    Comment: last use 2020    Review of Systems Constitutional: No fever/chills Eyes: No visual changes. ENT: No sore throat. Cardiovascular: Denies chest pain. Respiratory: Denies shortness of breath. Gastrointestinal: No abdominal pain.  No nausea, no vomiting.  No diarrhea.  No constipation. Genitourinary: Negative for dysuria. Musculoskeletal: Negative for back pain. Skin: + rash. Neurological: Negative for headaches, focal weakness or numbness.   ____________________________________________   PHYSICAL EXAM:  VITAL SIGNS: ED Triage Vitals  Enc Vitals Group     BP 01/08/21 1556 107/70     Pulse Rate 01/08/21 1556 78     Resp 01/08/21 1556 16     Temp 01/08/21 1556 98.4 F (36.9 C)     Temp Source 01/08/21 1556 Oral     SpO2 01/08/21 1556 100 %     Weight 01/08/21 1556 109 lb (49.4 kg)     Height 01/08/21 1556 5\' 4"  (1.626 m)     Head Circumference --      Peak  Flow --      Pain Score 01/08/21 1559 0     Pain Loc --      Pain Edu? --      Excl. in GC? --    Constitutional: Alert and oriented. Well appearing and in no acute distress. Eyes: Conjunctivae are normal. PERRL. EOMI. Head: Atraumatic. Nose: No congestion/rhinnorhea. Mouth/Throat: Mucous membranes are moist.  Oropharynx non-erythematous.  No swelling noted. Neck: No stridor.   Cardiovascular: Normal rate, regular rhythm. Grossly normal heart sounds.  Good peripheral circulation. Respiratory: Normal respiratory effort.  No retractions. Lungs  CTAB. Gastrointestinal: Soft and nontender. No distention. No abdominal bruits. No CVA tenderness. Musculoskeletal: No lower extremity tenderness nor edema.  No joint effusions. Neurologic:  Normal speech and language. No gross focal neurologic deficits are appreciated. No gait instability. Skin: There is a diffuse erythematous rash consisting of various shaped raised papular areas consistent with the appearance of hives.  No open wounds.  Visualized tattoo in the mid back is without any surrounding erythema or signs of infection. Psychiatric: Mood and affect are normal. Speech and behavior are normal.  ____________________________________________   LABS (all labs ordered are listed, but only abnormal results are displayed)  Labs Reviewed  CBC WITH DIFFERENTIAL/PLATELET - Abnormal; Notable for the following components:      Result Value   MCV 78.1 (*)    All other components within normal limits  HCG, QUANTITATIVE, PREGNANCY - Abnormal; Notable for the following components:   hCG, Beta Chain, Quant, S 778 (*)    All other components within normal limits  COMPREHENSIVE METABOLIC PANEL  TSH   ____________________________________________   INITIAL IMPRESSION / ASSESSMENT AND PLAN / ED COURSE  As part of my medical decision making, I reviewed the following data within the electronic MEDICAL RECORD NUMBER Nursing notes reviewed and incorporated, Labs reviewed and Notes from prior ED visits        Patient is a 20 year old female who recently found out she was pregnant who reports to the emergency department for evaluation of rash that began this morning.  She recently got a new tattoo and is unsure if this is related.  See HPI for further details.  In triage, the patient has normal vital signs.  On physical exam, she does have scattered erythematous raised lesions consistent with hives.  She denies any contact with new exposures.  She denies any swelling of the throat, lips or tongue, denies  difficulty breathing.  Given that she recently found she is pregnant, labs were obtained including CBC, CMP, TSH and quantitative hCG.  CBC CMP and TSH are within normal limits.  hCG returned at 778.  Discussed the timing of last menstrual period with the patient, at which time she informed me she had the Nexplanon removed after the start of last menstrual period, which would likely adjust her estimated weeks of gestation.  In the interim, she was treated with oral Benadryl and Pepcid with significant improvement in her symptoms.  Will decline use of steroid at this time given pregnancy status.  Encouraged use of home Benadryl, Pepcid and Zyrtec as needed.  Encourage close follow-up with OB/GYN provider if symptoms return, or return to ER for any difficulty breathing, swelling of the face tongue or throat.  Patient is amenable to this plan she stable this time for outpatient therapy.  Clinical Course as of 01/09/21 1826  Wed Jan 08, 2021  1751 HCG, Beta Francene Finders(!): 778 [RB]    Clinical Course User  Index [RB] Lorre Munroe, NP     ____________________________________________   FINAL CLINICAL IMPRESSION(S) / ED DIAGNOSES  Final diagnoses:  Urticaria     ED Discharge Orders    None      *Please note:  Cambree Hendrix was evaluated in Emergency Department on 01/09/2021 for the symptoms described in the history of present illness. She was evaluated in the context of the global COVID-19 pandemic, which necessitated consideration that the patient might be at risk for infection with the SARS-CoV-2 virus that causes COVID-19. Institutional protocols and algorithms that pertain to the evaluation of patients at risk for COVID-19 are in a state of rapid change based on information released by regulatory bodies including the CDC and federal and state organizations. These policies and algorithms were followed during the patient's care in the ED.  Some ED evaluations and interventions may be delayed  as a result of limited staffing during and the pandemic.*   Note:  This document was prepared using Dragon voice recognition software and may include unintentional dictation errors.   Lucy Chris, PA 01/09/21 1837    Gilles Chiquito, MD 01/10/21 1021

## 2021-01-24 DIAGNOSIS — O0990 Supervision of high risk pregnancy, unspecified, unspecified trimester: Secondary | ICD-10-CM | POA: Insufficient documentation

## 2021-02-25 LAB — OB RESULTS CONSOLE VARICELLA ZOSTER ANTIBODY, IGG: Varicella: IMMUNE

## 2021-02-25 LAB — OB RESULTS CONSOLE RUBELLA ANTIBODY, IGM: Rubella: IMMUNE

## 2021-07-09 ENCOUNTER — Encounter: Payer: Self-pay | Admitting: Obstetrics and Gynecology

## 2021-07-15 ENCOUNTER — Inpatient Hospital Stay: Payer: Medicaid Other | Attending: Oncology | Admitting: Oncology

## 2021-07-15 ENCOUNTER — Encounter: Payer: Self-pay | Admitting: Oncology

## 2021-07-15 ENCOUNTER — Inpatient Hospital Stay: Payer: Medicaid Other

## 2021-07-15 ENCOUNTER — Encounter (INDEPENDENT_AMBULATORY_CARE_PROVIDER_SITE_OTHER): Payer: Self-pay

## 2021-07-15 VITALS — BP 108/68 | HR 86 | Temp 97.8°F | Resp 20 | Wt 133.0 lb

## 2021-07-15 DIAGNOSIS — D508 Other iron deficiency anemias: Secondary | ICD-10-CM | POA: Diagnosis present

## 2021-07-15 DIAGNOSIS — O99013 Anemia complicating pregnancy, third trimester: Secondary | ICD-10-CM | POA: Insufficient documentation

## 2021-07-15 DIAGNOSIS — O9913 Other diseases of the blood and blood-forming organs and certain disorders involving the immune mechanism complicating the puerperium: Secondary | ICD-10-CM | POA: Diagnosis present

## 2021-07-15 DIAGNOSIS — D509 Iron deficiency anemia, unspecified: Secondary | ICD-10-CM

## 2021-07-15 HISTORY — DX: Iron deficiency anemia, unspecified: D50.9

## 2021-07-15 LAB — CBC WITH DIFFERENTIAL/PLATELET
Abs Immature Granulocytes: 0.06 10*3/uL (ref 0.00–0.07)
Basophils Absolute: 0 10*3/uL (ref 0.0–0.1)
Basophils Relative: 0 %
Eosinophils Absolute: 0.1 10*3/uL (ref 0.0–0.5)
Eosinophils Relative: 1 %
HCT: 29.3 % — ABNORMAL LOW (ref 36.0–46.0)
Hemoglobin: 9.4 g/dL — ABNORMAL LOW (ref 12.0–15.0)
Immature Granulocytes: 1 %
Lymphocytes Relative: 17 %
Lymphs Abs: 1.4 10*3/uL (ref 0.7–4.0)
MCH: 25.6 pg — ABNORMAL LOW (ref 26.0–34.0)
MCHC: 32.1 g/dL (ref 30.0–36.0)
MCV: 79.8 fL — ABNORMAL LOW (ref 80.0–100.0)
Monocytes Absolute: 0.6 10*3/uL (ref 0.1–1.0)
Monocytes Relative: 7 %
Neutro Abs: 6.5 10*3/uL (ref 1.7–7.7)
Neutrophils Relative %: 74 %
Platelets: 205 10*3/uL (ref 150–400)
RBC: 3.67 MIL/uL — ABNORMAL LOW (ref 3.87–5.11)
RDW: 13.2 % (ref 11.5–15.5)
WBC: 8.7 10*3/uL (ref 4.0–10.5)
nRBC: 0 % (ref 0.0–0.2)

## 2021-07-15 LAB — TECHNOLOGIST SMEAR REVIEW
Plt Morphology: NORMAL
RBC MORPHOLOGY: NORMAL
WBC MORPHOLOGY: NORMAL

## 2021-07-15 LAB — IRON AND TIBC
Iron: 344 ug/dL — ABNORMAL HIGH (ref 28–170)
Saturation Ratios: 66 % — ABNORMAL HIGH (ref 10.4–31.8)
TIBC: 522 ug/dL — ABNORMAL HIGH (ref 250–450)
UIBC: 178 ug/dL

## 2021-07-15 LAB — HEPATIC FUNCTION PANEL
ALT: 16 U/L (ref 0–44)
AST: 19 U/L (ref 15–41)
Albumin: 3.3 g/dL — ABNORMAL LOW (ref 3.5–5.0)
Alkaline Phosphatase: 67 U/L (ref 38–126)
Bilirubin, Direct: <0.1 mg/dL (ref 0.0–0.2)
Total Bilirubin: 0.4 mg/dL (ref 0.3–1.2)
Total Protein: 6.7 g/dL (ref 6.5–8.1)

## 2021-07-15 LAB — FERRITIN: Ferritin: 3 ng/mL — ABNORMAL LOW (ref 11–307)
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
## 2021-08-09 ENCOUNTER — Observation Stay
Admission: EM | Admit: 2021-08-09 | Discharge: 2021-08-10 | Disposition: A | Discharge status: Home / Self Care | Payer: Medicaid Other | Attending: Obstetrics and Gynecology | Admitting: Obstetrics and Gynecology

## 2021-08-09 ENCOUNTER — Other Ambulatory Visit: Payer: Self-pay

## 2021-08-09 DIAGNOSIS — O26893 Other specified pregnancy related conditions, third trimester: Principal | ICD-10-CM | POA: Insufficient documentation

## 2021-08-09 DIAGNOSIS — O479 False labor, unspecified: Secondary | ICD-10-CM | POA: Diagnosis present

## 2021-08-09 DIAGNOSIS — R109 Unspecified abdominal pain: Secondary | ICD-10-CM | POA: Diagnosis not present

## 2021-08-09 DIAGNOSIS — M549 Dorsalgia, unspecified: Secondary | ICD-10-CM | POA: Diagnosis not present

## 2021-08-09 DIAGNOSIS — Z3A35 35 weeks gestation of pregnancy: Secondary | ICD-10-CM | POA: Diagnosis not present

## 2021-08-09 LAB — URINALYSIS, COMPLETE (UACMP) WITH MICROSCOPIC
Bilirubin Urine: NEGATIVE
Glucose, UA: NEGATIVE mg/dL
Ketones, ur: >160 mg/dL — AB
Nitrite: NEGATIVE
Protein, ur: NEGATIVE mg/dL
Specific Gravity, Urine: 1.02 (ref 1.005–1.030)
Squamous Epithelial / HPF: NONE SEEN (ref 0–5)
pH: 7.5 (ref 5.0–8.0)

## 2021-08-09 LAB — WET PREP, GENITAL
Clue Cells Wet Prep HPF POC: NONE SEEN
Trich, Wet Prep: NONE SEEN
Yeast Wet Prep HPF POC: NONE SEEN

## 2021-08-09 MED ORDER — ACETAMINOPHEN 325 MG PO TABS: 650.0000 mg | ORAL_TABLET | ORAL | Status: DC | PRN

## 2021-08-09 MED ORDER — LACTATED RINGERS IV SOLN: INTRAVENOUS | Status: DC

## 2021-08-09 MED ORDER — LACTATED RINGERS IV BOLUS
500.0000 mL | Freq: Once | INTRAVENOUS | Status: AC
  Administered 2021-08-09: 500 mL via INTRAVENOUS

## 2021-08-09 NOTE — Discharge Summary (Signed)
Patient ID: Karen Lucero MRN: [id number] DOB/AGE: 02/23/2001 19 y.o.  Admit date: 08/09/2021 Discharge date: 08/10/2021  Admission Diagnoses: 19yo G1P0 at [redacted]w[redacted]d presents with abdominal and back pain that started 2 days ago, and became worse tonight.  Reports recent IC. Denies LOF or VB.   Discharge Diagnoses: Uterine contractions resolved  Factors complicating pregnancy: 1. Teen pregnancy 2. Chlamydia and BV at NOB, rx sent 4/5 for pt and partner 3. Anemia  Prenatal Procedures: NST  Consults: None  Significant Diagnostic Studies:  Results for orders placed or performed during the hospital encounter of 08/09/21 (from the past 168 hour(s))  Wet prep, genital   Collection Time: 08/09/21 10:54 PM   Specimen: Vaginal  Result Value Ref Range   Yeast Wet Prep HPF POC NONE SEEN NONE SEEN   Trich, Wet Prep NONE SEEN NONE SEEN   Clue Cells Wet Prep HPF POC NONE SEEN NONE SEEN   WBC, Wet Prep HPF POC FEW (A) NONE SEEN   Sperm PRESENT   Chlamydia/NGC rt PCR ([facility] only)   Collection Time: 08/09/21 10:54 PM   Specimen: Vaginal  Result Value Ref Range   Specimen source GC/Chlam ENDOCERVICAL    Chlamydia Tr NOT DETECTED NOT DETECTED   N gonorrhoeae NOT DETECTED NOT DETECTED  Urinalysis, Complete w Microscopic Urine, Clean Catch   Collection Time: 08/09/21 10:54 PM  Result Value Ref Range   Color, Urine YELLOW YELLOW   APPearance CLEAR (A) CLEAR   Specific Gravity, Urine 1.020 1.005 - 1.030   pH 7.5 5.0 - 8.0   Glucose, UA NEGATIVE NEGATIVE mg/dL   Hgb urine dipstick TRACE (A) NEGATIVE   Bilirubin Urine NEGATIVE NEGATIVE   Ketones, ur >160 (A) NEGATIVE mg/dL   Protein, ur NEGATIVE NEGATIVE mg/dL   Nitrite NEGATIVE NEGATIVE   Leukocytes,Ua TRACE (A) NEGATIVE   RBC / HPF 0-5 0 - 5 RBC/hpf   WBC, UA 21-50 0 - 5 WBC/hpf   Bacteria, UA RARE (A) NONE SEEN   Squamous Epithelial / LPF NONE SEEN 0 - 5   Mucus PRESENT    Sperm, UA PRESENT     Treatments: IV hydration  Hospital  Course:  This is a 20 y.o. G1P0000 with IUP at [redacted]w[redacted]d see for abdominal and back pain, noted to have a cervical exam of 0/TH/high.  No leaking of fluid and no bleeding.   She was observed, fetal heart rate monitoring remained reassuring, and she had no signs/symptoms of progressing preterm labor or other maternal-fetal concerns.  Her cervical exam was unchanged from admission.  She was deemed stable for discharge to home with outpatient follow up.  Discharge Physical Exam:  BP 114/65 (BP Location: Left Arm)   Pulse 85   Temp 98.7 F (37.1 C) (Oral)   Resp 16   LMP 12/03/2020 (Exact Date)   General: NAD CV: RRR Pulm: CTABL, nl effort ABD: s/nd/nt, gravid DVT Evaluation: LE non-ttp, no evidence of DVT on exam.  NST: FHR baseline: 135 bpm Variability: moderate Accelerations: yes Decelerations: none Category/reactivity: reactive TOCO: quiet  Dilation: Closed Effacement (%): Thick Cervical Position: Posterior Station: Ballotable Presentation: Undeterminable Exam by:: Anastasia Pall RN   Discharge Condition: Stable  Disposition: Discharge disposition: 01-Home or Self Care       Allergies as of 08/10/2021   No Known Allergies      Medication List     STOP taking these medications    Nexplanon 68 MG Impl implant Generic drug: etonogestrel  TAKE these medications    ferrous sulfate 325 (65 FE) MG tablet Take 1 tablet by mouth daily with breakfast.   PNV Prenatal Plus Multivitamin 27-1 MG Tabs Take 1 tablet by mouth daily.        Follow-up Information     [facility]. Go to.   Why: your prenatal appt this week Contact information: [address] [place] [address] [phone]                Signed:  [name], Ina Homes 08/10/2021 1:06 AM

## 2021-08-09 NOTE — OB Triage Note (Signed)
Pt G1P0 [redacted]w[redacted]d presents to [facility] via ED w/ c/o abdominal and back pain that started 2 days ago and has gotten worse. States she tried taking a warm shower and tylenol but they did not help. Reports no LOF, no vag bleeding, and +FM. Reports some white vaginal discharge. VSS. Monitors applied, FHT's in 140's.

## 2021-08-10 DIAGNOSIS — O26893 Other specified pregnancy related conditions, third trimester: Secondary | ICD-10-CM | POA: Diagnosis not present

## 2021-08-10 LAB — CHLAMYDIA/NGC RT PCR (ARMC ONLY)
Chlamydia Tr: NOT DETECTED
N gonorrhoeae: NOT DETECTED

## 2021-08-11 ENCOUNTER — Inpatient Hospital Stay: Payer: Medicaid Other | Attending: Oncology

## 2021-08-12 ENCOUNTER — Inpatient Hospital Stay: Payer: Medicaid Other

## 2021-08-12 ENCOUNTER — Inpatient Hospital Stay: Payer: Medicaid Other | Admitting: Oncology

## 2021-09-01 LAB — OB RESULTS CONSOLE HIV ANTIBODY (ROUTINE TESTING): HIV: NONREACTIVE

## 2021-09-02 ENCOUNTER — Other Ambulatory Visit: Payer: Self-pay

## 2021-09-02 DIAGNOSIS — O365990 Maternal care for other known or suspected poor fetal growth, unspecified trimester, not applicable or unspecified: Secondary | ICD-10-CM | POA: Insufficient documentation

## 2021-09-02 DIAGNOSIS — O0993 Supervision of high risk pregnancy, unspecified, third trimester: Secondary | ICD-10-CM

## 2021-09-02 DIAGNOSIS — O99013 Anemia complicating pregnancy, third trimester: Secondary | ICD-10-CM | POA: Insufficient documentation

## 2021-09-02 DIAGNOSIS — D509 Iron deficiency anemia, unspecified: Secondary | ICD-10-CM

## 2021-09-02 DIAGNOSIS — O365930 Maternal care for other known or suspected poor fetal growth, third trimester, not applicable or unspecified: Secondary | ICD-10-CM

## 2021-09-02 NOTE — Progress Notes (Signed)
G1P0000 at [redacted]w[redacted]d, LMP of 12/03/2020, not c/w early [place] at [redacted]w[redacted]d.  Scheduled for induction of labor for IUGR on 09/09/2021 at 0001.   Prenatal provider: [facility] Pregnancy complicated by: IUGR - EFW 13 % with AC in 3% Teen pregnancy  Chlamydia positive at NOB - repeat negative after treatment  Maternal iron deficiency anemia  GBS positive   Prenatal Labs: Blood type/Rh O pos   Antibody screen neg  Rubella Immune  Varicella Immune  RPR NR  HBsAg Neg  HIV NR  GC neg  Chlamydia neg  Genetic screening cfDNA negative  1 hour GTT 108  3 hour GTT N/A  GBS Positive    Tdap: declined  Flu: declined  Contraception: TBD Feeding preference: TBD  ____ Margaretmary Eddy, CNM Certified Nurse Midwife [place]  [facility]

## 2021-09-05 ENCOUNTER — Ambulatory Visit
Admission: RE | Admit: 2021-09-05 | Discharge: 2021-09-05 | Disposition: A | Discharge status: Home / Self Care | Payer: Medicaid Other | Source: Ambulatory Visit

## 2021-09-05 ENCOUNTER — Other Ambulatory Visit: Payer: Self-pay

## 2021-09-05 DIAGNOSIS — O99013 Anemia complicating pregnancy, third trimester: Secondary | ICD-10-CM | POA: Insufficient documentation

## 2021-09-05 DIAGNOSIS — Z3A00 Weeks of gestation of pregnancy not specified: Secondary | ICD-10-CM | POA: Insufficient documentation

## 2021-09-05 DIAGNOSIS — D649 Anemia, unspecified: Secondary | ICD-10-CM | POA: Diagnosis not present

## 2021-09-05 DIAGNOSIS — D509 Iron deficiency anemia, unspecified: Secondary | ICD-10-CM

## 2021-09-05 MED ORDER — ACETAMINOPHEN 500 MG PO TABS: 1000.0000 mg | ORAL_TABLET | Freq: Once | ORAL | Status: AC

## 2021-09-05 MED ORDER — ACETAMINOPHEN 500 MG PO TABS
ORAL_TABLET | ORAL | Status: AC
  Administered 2021-09-05: 1000 mg via ORAL
  Filled 2021-09-05: qty 2

## 2021-09-05 MED ORDER — SODIUM CHLORIDE 0.9 % IV SOLN
300.0000 mg | Freq: Once | INTRAVENOUS | Status: AC
  Administered 2021-09-05: 300 mg via INTRAVENOUS
  Filled 2021-09-05: qty 300

## 2021-09-09 ENCOUNTER — Inpatient Hospital Stay: Payer: Medicaid Other | Admitting: Anesthesiology

## 2021-09-09 ENCOUNTER — Encounter: Payer: Self-pay | Admitting: Obstetrics and Gynecology

## 2021-09-09 ENCOUNTER — Other Ambulatory Visit: Payer: Self-pay

## 2021-09-09 ENCOUNTER — Inpatient Hospital Stay
Admission: EM | Admit: 2021-09-09 | Discharge: 2021-09-10 | DRG: 807 | Disposition: A | Discharge status: Home / Self Care | Payer: Medicaid Other | Attending: Obstetrics | Admitting: Obstetrics

## 2021-09-09 DIAGNOSIS — Z20822 Contact with and (suspected) exposure to covid-19: Secondary | ICD-10-CM | POA: Diagnosis present

## 2021-09-09 DIAGNOSIS — D509 Iron deficiency anemia, unspecified: Secondary | ICD-10-CM | POA: Diagnosis present

## 2021-09-09 DIAGNOSIS — Z3A40 40 weeks gestation of pregnancy: Secondary | ICD-10-CM

## 2021-09-09 DIAGNOSIS — O9902 Anemia complicating childbirth: Secondary | ICD-10-CM | POA: Diagnosis present

## 2021-09-09 DIAGNOSIS — O99824 Streptococcus B carrier state complicating childbirth: Secondary | ICD-10-CM | POA: Diagnosis present

## 2021-09-09 DIAGNOSIS — O365930 Maternal care for other known or suspected poor fetal growth, third trimester, not applicable or unspecified: Secondary | ICD-10-CM | POA: Diagnosis present

## 2021-09-09 DIAGNOSIS — O0990 Supervision of high risk pregnancy, unspecified, unspecified trimester: Secondary | ICD-10-CM

## 2021-09-09 DIAGNOSIS — O0993 Supervision of high risk pregnancy, unspecified, third trimester: Secondary | ICD-10-CM

## 2021-09-09 DIAGNOSIS — O365931 Maternal care for other known or suspected poor fetal growth, third trimester, fetus 1: Secondary | ICD-10-CM | POA: Diagnosis present

## 2021-09-09 LAB — CBC
HCT: 30.4 % — ABNORMAL LOW (ref 36.0–46.0)
Hemoglobin: 10.4 g/dL — ABNORMAL LOW (ref 12.0–15.0)
MCH: 26.7 pg (ref 26.0–34.0)
MCHC: 34.2 g/dL (ref 30.0–36.0)
MCV: 77.9 fL — ABNORMAL LOW (ref 80.0–100.0)
Platelets: 181 10*3/uL (ref 150–400)
RBC: 3.9 MIL/uL (ref 3.87–5.11)
RDW: 16.2 % — ABNORMAL HIGH (ref 11.5–15.5)
WBC: 7.5 10*3/uL (ref 4.0–10.5)
nRBC: 0 % (ref 0.0–0.2)

## 2021-09-09 LAB — RESP PANEL BY RT-PCR (FLU A&B, COVID) ARPGX2
Influenza A by PCR: NEGATIVE
Influenza B by PCR: NEGATIVE
SARS Coronavirus 2 by RT PCR: NEGATIVE

## 2021-09-09 LAB — RPR: RPR Ser Ql: NONREACTIVE

## 2021-09-09 LAB — TYPE AND SCREEN
ABO/RH(D): O POS
Antibody Screen: NEGATIVE

## 2021-09-09 LAB — ABO/RH: ABO/RH(D): O POS

## 2021-09-09 MED ORDER — FENTANYL-BUPIVACAINE-NACL 0.5-0.125-0.9 MG/250ML-% EP SOLN
12.0000 mL/h | EPIDURAL | Status: DC | PRN
  Administered 2021-09-09: 12 mL/h via EPIDURAL

## 2021-09-09 MED ORDER — ONDANSETRON HCL 4 MG PO TABS: 4.0000 mg | ORAL_TABLET | ORAL | Status: DC | PRN

## 2021-09-09 MED ORDER — ACETAMINOPHEN 500 MG PO TABS: 1000.0000 mg | ORAL_TABLET | Freq: Four times a day (QID) | ORAL | Status: DC | PRN

## 2021-09-09 MED ORDER — PENICILLIN G POT IN DEXTROSE 60000 UNIT/ML IV SOLN
3.0000 10*6.[IU] | INTRAVENOUS | Status: DC
  Administered 2021-09-09: 3 10*6.[IU] via INTRAVENOUS
  Filled 2021-09-09 (×5): qty 50

## 2021-09-09 MED ORDER — LIDOCAINE HCL (PF) 1 % IJ SOLN
INTRAMUSCULAR | Status: DC | PRN
  Administered 2021-09-09: 2 mL via INTRADERMAL

## 2021-09-09 MED ORDER — LACTATED RINGERS IV SOLN: 500.0000 mL | INTRAVENOUS | Status: DC | PRN

## 2021-09-09 MED ORDER — LACTATED RINGERS IV SOLN: 500.0000 mL | Freq: Once | INTRAVENOUS | Status: DC

## 2021-09-09 MED ORDER — SODIUM CHLORIDE 0.9% FLUSH: 3.0000 mL | Freq: Two times a day (BID) | INTRAVENOUS | Status: DC

## 2021-09-09 MED ORDER — ACETAMINOPHEN 325 MG PO TABS
650.0000 mg | ORAL_TABLET | ORAL | Status: DC | PRN
  Administered 2021-09-10: 650 mg via ORAL
  Filled 2021-09-09 (×2): qty 2

## 2021-09-09 MED ORDER — LIDOCAINE-EPINEPHRINE (PF) 1.5 %-1:200000 IJ SOLN
INTRAMUSCULAR | Status: DC | PRN
  Administered 2021-09-09: 3 mL via EPIDURAL

## 2021-09-09 MED ORDER — SOD CITRATE-CITRIC ACID 500-334 MG/5ML PO SOLN: 30.0000 mL | ORAL | Status: DC | PRN

## 2021-09-09 MED ORDER — OXYTOCIN-SODIUM CHLORIDE 30-0.9 UT/500ML-% IV SOLN
2.5000 [IU]/h | INTRAVENOUS | Status: DC
  Administered 2021-09-09: 2.5 [IU]/h via INTRAVENOUS
  Filled 2021-09-09: qty 500

## 2021-09-09 MED ORDER — SODIUM CHLORIDE 0.9% FLUSH: 3.0000 mL | INTRAVENOUS | Status: DC | PRN

## 2021-09-09 MED ORDER — EPHEDRINE 5 MG/ML INJ
10.0000 mg | INTRAVENOUS | Status: DC | PRN
  Filled 2021-09-09: qty 2

## 2021-09-09 MED ORDER — OXYTOCIN 10 UNIT/ML IJ SOLN
INTRAMUSCULAR | Status: AC
  Filled 2021-09-09: qty 2

## 2021-09-09 MED ORDER — ZOLPIDEM TARTRATE 5 MG PO TABS: 5.0000 mg | ORAL_TABLET | Freq: Every evening | ORAL | Status: DC | PRN

## 2021-09-09 MED ORDER — MISOPROSTOL 200 MCG PO TABS
ORAL_TABLET | ORAL | Status: AC
  Filled 2021-09-09: qty 4

## 2021-09-09 MED ORDER — FERROUS SULFATE 325 (65 FE) MG PO TABS
325.0000 mg | ORAL_TABLET | Freq: Two times a day (BID) | ORAL | Status: DC
Start: 2021-09-10 — End: 2021-09-11
  Administered 2021-09-10: 325 mg via ORAL
  Filled 2021-09-09 (×2): qty 1

## 2021-09-09 MED ORDER — ONDANSETRON HCL 4 MG/2ML IJ SOLN: 4.0000 mg | INTRAMUSCULAR | Status: DC | PRN

## 2021-09-09 MED ORDER — LACTATED RINGERS IV SOLN: INTRAVENOUS | Status: DC

## 2021-09-09 MED ORDER — SENNOSIDES-DOCUSATE SODIUM 8.6-50 MG PO TABS: 2.0000 | ORAL_TABLET | Freq: Every day | ORAL | Status: DC

## 2021-09-09 MED ORDER — IBUPROFEN 600 MG PO TABS
600.0000 mg | ORAL_TABLET | Freq: Four times a day (QID) | ORAL | Status: DC
  Administered 2021-09-09 – 2021-09-10 (×2): 600 mg via ORAL
  Filled 2021-09-09 (×4): qty 1

## 2021-09-09 MED ORDER — BUPIVACAINE HCL (PF) 0.25 % IJ SOLN
INTRAMUSCULAR | Status: DC | PRN
Start: 2021-09-09 — End: 2021-09-09
  Administered 2021-09-09 (×2): 5 mL via EPIDURAL

## 2021-09-09 MED ORDER — COCONUT OIL OIL
1.0000 "application " | TOPICAL_OIL | Status: DC | PRN
  Administered 2021-09-09: 1 via TOPICAL
  Filled 2021-09-09: qty 120

## 2021-09-09 MED ORDER — LIDOCAINE HCL (PF) 1 % IJ SOLN
30.0000 mL | INTRAMUSCULAR | Status: DC | PRN
  Filled 2021-09-09: qty 30

## 2021-09-09 MED ORDER — MISOPROSTOL 25 MCG QUARTER TABLET
25.0000 ug | ORAL_TABLET | ORAL | Status: DC | PRN
  Administered 2021-09-09 (×2): 25 ug via VAGINAL
  Filled 2021-09-09 (×3): qty 1

## 2021-09-09 MED ORDER — AMMONIA AROMATIC IN INHA
RESPIRATORY_TRACT | Status: AC
  Filled 2021-09-09: qty 10

## 2021-09-09 MED ORDER — OXYTOCIN BOLUS FROM INFUSION
333.0000 mL | Freq: Once | INTRAVENOUS | Status: AC
  Administered 2021-09-09: 333 mL via INTRAVENOUS

## 2021-09-09 MED ORDER — DIPHENHYDRAMINE HCL 50 MG/ML IJ SOLN: 12.5000 mg | INTRAMUSCULAR | Status: DC | PRN

## 2021-09-09 MED ORDER — TERBUTALINE SULFATE 1 MG/ML IJ SOLN: 0.2500 mg | Freq: Once | INTRAMUSCULAR | Status: DC | PRN

## 2021-09-09 MED ORDER — DIPHENHYDRAMINE HCL 25 MG PO CAPS: 25.0000 mg | ORAL_CAPSULE | Freq: Four times a day (QID) | ORAL | Status: DC | PRN

## 2021-09-09 MED ORDER — SODIUM CHLORIDE 0.9 % IV SOLN
5.0000 10*6.[IU] | Freq: Once | INTRAVENOUS | Status: AC
  Administered 2021-09-09: 5 10*6.[IU] via INTRAVENOUS
  Filled 2021-09-09: qty 5

## 2021-09-09 MED ORDER — PHENYLEPHRINE 40 MCG/ML (10ML) SYRINGE FOR IV PUSH (FOR BLOOD PRESSURE SUPPORT)
80.0000 ug | PREFILLED_SYRINGE | INTRAVENOUS | Status: DC | PRN
  Filled 2021-09-09: qty 10

## 2021-09-09 MED ORDER — SIMETHICONE 80 MG PO CHEW: 80.0000 mg | CHEWABLE_TABLET | ORAL | Status: DC | PRN

## 2021-09-09 MED ORDER — BENZOCAINE-MENTHOL 20-0.5 % EX AERO
1.0000 "application " | INHALATION_SPRAY | CUTANEOUS | Status: DC | PRN
  Administered 2021-09-10: 1 via TOPICAL
  Filled 2021-09-09: qty 56

## 2021-09-09 MED ORDER — WITCH HAZEL-GLYCERIN EX PADS
1.0000 "application " | MEDICATED_PAD | CUTANEOUS | Status: DC | PRN
  Administered 2021-09-10: 1 via TOPICAL
  Filled 2021-09-09: qty 100

## 2021-09-09 MED ORDER — ONDANSETRON HCL 4 MG/2ML IJ SOLN
4.0000 mg | Freq: Four times a day (QID) | INTRAMUSCULAR | Status: DC | PRN
  Administered 2021-09-09: 4 mg via INTRAVENOUS
  Filled 2021-09-09: qty 2

## 2021-09-09 MED ORDER — FENTANYL-BUPIVACAINE-NACL 0.5-0.125-0.9 MG/250ML-% EP SOLN
EPIDURAL | Status: AC
  Filled 2021-09-09: qty 250

## 2021-09-09 MED ORDER — MISOPROSTOL 25 MCG QUARTER TABLET
25.0000 ug | ORAL_TABLET | ORAL | Status: DC | PRN
  Administered 2021-09-09 (×2): 25 ug via BUCCAL
  Filled 2021-09-09 (×3): qty 1

## 2021-09-09 MED ORDER — PRENATAL MULTIVITAMIN CH: 1.0000 | ORAL_TABLET | Freq: Every day | ORAL | Status: DC

## 2021-09-09 MED ORDER — BUTORPHANOL TARTRATE 1 MG/ML IJ SOLN
1.0000 mg | INTRAMUSCULAR | Status: DC | PRN
  Administered 2021-09-09 (×2): 1 mg via INTRAVENOUS
  Filled 2021-09-09 (×2): qty 1

## 2021-09-09 MED ORDER — OXYTOCIN-SODIUM CHLORIDE 30-0.9 UT/500ML-% IV SOLN
1.0000 m[IU]/min | INTRAVENOUS | Status: DC
  Administered 2021-09-09: 2 m[IU]/min via INTRAVENOUS
  Filled 2021-09-09: qty 500

## 2021-09-09 MED ORDER — DIBUCAINE (PERIANAL) 1 % EX OINT: 1.0000 "application " | TOPICAL_OINTMENT | CUTANEOUS | Status: DC | PRN

## 2021-09-09 MED ORDER — SODIUM CHLORIDE 0.9 % IV SOLN: 250.0000 mL | INTRAVENOUS | Status: DC | PRN

## 2021-09-09 NOTE — Anesthesia Procedure Notes (Signed)
Epidural Patient location during procedure: OB  Staffing Anesthesiologist: Alver Fisher, MD Resident/CRNA: Mathews Argyle, CRNA Performed: resident/CRNA   Preanesthetic Checklist Completed: patient identified, IV checked, site marked, risks and benefits discussed, surgical consent, monitors and equipment checked, pre-op evaluation and timeout performed  Epidural Patient position: sitting Prep: ChloraPrep and site prepped and draped Patient monitoring: heart rate, continuous pulse ox and blood pressure Approach: midline Location: L4-L5 Injection technique: LOR saline  Needle:  Needle type: Tuohy  Needle gauge: 17 G Needle length: 9 cm and 9 Needle insertion depth: 7 cm Catheter type: closed end flexible Catheter size: 19 Gauge Catheter at skin depth: 12 cm Test dose: negative and 1.5% lidocaine with Epi 1:200 K  Assessment Events: blood not aspirated, injection not painful, no injection resistance, no paresthesia and negative IV test  Additional Notes   Patient tolerated the insertion well without complications.Reason for block:procedure for pain

## 2021-09-09 NOTE — H&P (Signed)
OB History & Physical   History of Present Illness:   Chief Complaint: IOL for IUGR  HPI:  Karen Lucero is a 20 y.o. G1P0000 female at [redacted]w[redacted]d dated by [place].  She presents to L&D for Medical IOL  Reports active fetal movement  Contractions: denies  LOF/SROM: none Vaginal bleeding: none  Factors complicating pregnancy:  IUGR - EFW 13 % with AC in 3% Teen pregnancy  Chlamydia positive at NOB - repeat negative after treatment  Maternal iron deficiency anemia  GBS positive  Patient Active Problem List   Diagnosis Date Noted   IUGR (intrauterine growth restriction) affecting care of mother, third trimester, fetus 1 09/09/2021   Anemia in pregnancy, third trimester 09/02/2021   IUGR (intrauterine growth restriction) affecting care of mother 09/02/2021   IDA (iron deficiency anemia) 07/15/2021   Supervision of high risk pregnancy, antepartum 01/24/2021     Maternal Medical History:   Past Medical History:  Diagnosis Date   IDA (iron deficiency anemia) 07/15/2021    Past Surgical History:  Procedure Laterality Date   knee laceration     repaired with skin sutures    Not on File  Prior to Admission medications   Medication Sig Start Date End Date Taking? Authorizing Provider  ferrous sulfate 325 (65 FE) MG tablet Take 1 tablet by mouth daily with breakfast. 07/11/21 07/11/22  [provider]  Prenatal Vit-Fe Fumarate-FA (PNV PRENATAL PLUS MULTIVITAMIN) 27-1 MG TABS Take 1 tablet by mouth daily.    [provider]     Prenatal care site:  [facility]  Social History: She  reports that she has never smoked. She has never used smokeless tobacco. She reports that she does not currently use alcohol. She reports that she does not currently use drugs.  Family History: family history includes Arthritis in her maternal grandmother; Autism in her sister; Depression in her maternal grandfather, maternal grandmother, and mother; Diabetes in her maternal  grandmother; Hypercholesterolemia in her maternal grandmother.   Review of Systems: A full review of systems was performed and negative except as noted in the HPI.     Physical Exam:  Vital Signs: BP 108/70 (BP Location: Left Arm)   Pulse 93   Temp 98.3 F (36.8 C) (Oral)   Resp 16   Ht 5\' 3"  (1.6 m)   Wt 62.6 kg   LMP 12/03/2020 (Exact Date)   BMI 24.45 kg/m  Physical Exam Constitutional:      Appearance: Normal appearance. She is normal weight.  HENT:     Head: Normocephalic.     Nose: Nose normal.     Mouth/Throat:     Mouth: Mucous membranes are moist.  Cardiovascular:     Rate and Rhythm: Normal rate and regular rhythm.     Pulses: Normal pulses.     Heart sounds: Normal heart sounds.  Pulmonary:     Effort: Pulmonary effort is normal.     Breath sounds: Normal breath sounds.  Abdominal:     Comments: Gravid  Genitourinary:    General: Normal vulva.     Vagina: Normal.     Cervix: Normal.     Uterus: Enlarged.   Musculoskeletal:        General: Normal range of motion.     Cervical back: Normal range of motion and neck supple.  Skin:    General: Skin is warm and dry.  Neurological:     Mental Status: She is alert and oriented to person, place, and  time.  Psychiatric:        Mood and Affect: Mood normal.        Behavior: Behavior normal.        Thought Content: Thought content normal.        Judgment: Judgment normal.    General: no acute distress.  HEENT: normocephalic, atraumatic Heart: regular rate & rhythm.  No murmurs/rubs/gallops Lungs: clear to auscultation bilaterally, normal respiratory effort Abdomen: soft, gravid, non-tender;  EFW: 5.12lbs Pelvic:   External: Normal external female genitalia  Cervix: Closed/thick/high   Extremities: non-tender, symmetric, no edema bilaterally.  DTRs: +2  Neurologic: Alert & oriented x 3.    Results for orders placed or performed during the hospital encounter of 09/09/21 (from the past 24 hour(s))  CBC      Status: Abnormal   Collection Time: 09/09/21 12:41 AM  Result Value Ref Range   WBC 7.5 4.0 - 10.5 K/uL   RBC 3.90 3.87 - 5.11 MIL/uL   Hemoglobin 10.4 (L) 12.0 - 15.0 g/dL   HCT 73.2 (L) 20.2 - 54.2 %   MCV 77.9 (L) 80.0 - 100.0 fL   MCH 26.7 26.0 - 34.0 pg   MCHC 34.2 30.0 - 36.0 g/dL   RDW 70.6 (H) 23.7 - 62.8 %   Platelets 181 150 - 400 K/uL   nRBC 0.0 0.0 - 0.2 %  Type and screen     Status: None (Preliminary result)   Collection Time: 09/09/21 12:41 AM  Result Value Ref Range   ABO/RH(D) PENDING    Antibody Screen PENDING    Sample Expiration      09/12/2021,2359 Performed at [facility], [address], [place], [place] [address]     Pertinent Results:  Prenatal Labs: Blood type/Rh O Pos  Antibody screen neg  Rubella Immune  Varicella Immune  RPR NR  HBsAg Neg  HIV NR  GC neg  Chlamydia neg  Genetic screening cfDNA negative   1 hour GTT 108  3 hour GTT N/A  GBS Pos   FHT:  FHR: 125 bpm, variability: moderate,  accelerations:  Present,  decelerations:  Absent Category/reactivity:  Category I UC:   none   Cephalic by Leopolds and SVE   No results found.  Assessment:  Karen Lucero is a 20 y.o. G1P0000 female at [redacted]w[redacted]d with IUGR- EFW 13% with AC in 3 %.   Plan:  1. Admit to Labor & Delivery; consents reviewed and obtained - Covid admission screen   2. Fetal Well being  - Fetal Tracing: Category 1 - Group B Streptococcus ppx  indicated: GBS Positive - Presentation: cephalic confirmed by SVE   3. Routine OB: - Prenatal labs reviewed, as above - Rh Pos - CBC, T&S, RPR on admit - Clear fluids, IVF  4. Induction of labor  - Contractions monitored with external toco - Plan for induction with misoprostol and oxytocin  - Augmentation with AROM as appropriate  - Plan for  continuous fetal monitoring - Maternal pain control as desired; planning regional anesthesia - Anticipate vaginal delivery  5. Post Partum Planning: - Infant feeding:  TBD - Contraception: TBD - Tdap vaccine: declined - Flu vaccine: declined  Chari Manning CNM Certified Nurse Midwife [place]  [facility]

## 2021-09-09 NOTE — Progress Notes (Signed)
Labor Progress Note  Runa Whittingham is a 20 y.o. G1P0000 at [redacted]w[redacted]d by ultrasound at [redacted]w[redacted]d, admitted for IOL for IUGR.   Subjective: comfortable now after epidural.   Objective: BP (!) 106/48   Pulse 70   Temp 97.6 F (36.4 C) (Oral)   Resp 16   Ht 5\' 3"  (1.6 m)   Wt 62.6 kg   LMP 12/03/2020 (Exact Date)   SpO2 95%   BMI 24.45 kg/m  Notable VS details: reviewed.   General: no acute distress.  Abdomen: soft, gravid, non-tender;  EFW: 5.12lbs   Fetal Assessment: FHT:  FHR: 140 bpm, variability: moderate,  accelerations:  Present,  decelerations:  Present early, variable and occasional late Category/reactivity:  Category II UC:   q 1.5-2 min, palpate moderate. Pitocin at  79mu SVE: 1/90/0  Membrane status: SROM clear fluid at 1100   Labs: Lab Results  Component Value Date   WBC 7.5 09/09/2021   HGB 10.4 (L) 09/09/2021   HCT 30.4 (L) 09/09/2021   MCV 77.9 (L) 09/09/2021   PLT 181 09/09/2021    Assessment / Plan: G1p0 at 40.0wks, IOL for IUGR  Labor: s/p cytotec x 2 doses, currently on Pitocin and s/p SROM.  Preeclampsia:  n/a Fetal Wellbeing:  Category II Pain Control:  Epidural I/D:  GBS pos, PCN given, 1st dose at 1230 Anticipated MOD:  NSVD  09/11/2021 Stephie Xu, CNM 09/09/2021, 3:19 PM

## 2021-09-09 NOTE — Discharge Summary (Signed)
Obstetrical Discharge Summary  Patient Name: Karen Lucero DOB: 03-26-2001 MRN: [id number]  Date of Admission: 09/09/2021 Date of Delivery: 09/09/21 Delivered by: Heloise Ochoa CNM Date of Discharge: 09/10/2021  Primary OB: [facility]  [id number] last menstrual period was 12/03/2020 (exact date). EDC Estimated Date of Delivery: 09/09/21 Gestational Age at Delivery: [redacted]w[redacted]d   Antepartum complications:  IUGR - EFW 13 % with AC in 3% Teen pregnancy  Chlamydia positive at NOB - repeat negative after treatment  Maternal iron deficiency anemia  GBS positive  Admitting Diagnosis: IUGR, IOL Secondary Diagnosis: SVD, body cord  Patient Active Problem List   Diagnosis Date Noted   IUGR (intrauterine growth restriction) affecting care of mother, third trimester, fetus 1 09/09/2021   Anemia in pregnancy, third trimester 09/02/2021   IUGR (intrauterine growth restriction) affecting care of mother 09/02/2021   IDA (iron deficiency anemia) 07/15/2021   Supervision of high risk pregnancy, antepartum 01/24/2021    Augmentation: Pitocin and Cytotec Complications: None Intrapartum complications/course: see delivery note Date of Delivery: 09/09/21 Delivered By: Heloise Ochoa CNM Delivery Type: spontaneous vaginal delivery Anesthesia: epidural Placenta: spontaneous Laceration: hymenal ring abrasions, no repair.  Episiotomy: none Newborn Data: Live born female "[name]" Birth Weight: 6 lb 5.2 oz (2870 g) APGAR: 8, 9  Newborn Delivery   Birth date/time: 09/09/2021 18:28:00 Delivery type: Vaginal, Spontaneous      Postpartum Procedures: none  Edinburgh:  Edinburgh Postnatal Depression Scale Screening Tool 09/10/2021 09/09/2021  I have been able to laugh and see the funny side of things. (No Data) (No Data)      Post partum course:  Patient had an uncomplicated postpartum course.  By time of discharge on PPD#1, her pain was controlled on oral pain medications; she  had appropriate lochia and was ambulating, voiding without difficulty and tolerating regular diet.  She was deemed stable for discharge to home.    Discharge Physical Exam:  BP 112/85 (BP Location: Right Arm)   Pulse 73   Temp 98.2 F (36.8 C) (Oral)   Resp 18   Ht 5\' 3"  (1.6 m)   Wt 62.6 kg   LMP 12/03/2020 (Exact Date)   SpO2 99% Comment: Room Air  Breastfeeding Unknown   BMI 24.45 kg/m   General: NAD CV: RRR Pulm: CTABL, nl effort ABD: s/nd/nt, fundus firm and below the umbilicus Lochia: moderate Perineum: intact DVT Evaluation: LE non-ttp, no evidence of DVT on exam.  Hemoglobin  Date Value Ref Range Status  09/10/2021 10.7 (L) 12.0 - 15.0 g/dL Final   HCT  Date Value Ref Range Status  09/10/2021 31.6 (L) 36.0 - 46.0 % Final     Disposition: stable, discharge to home. Baby Feeding: breastmilk Baby Disposition: home with mom  Rh Immune globulin given: n/a Rubella vaccine given: immune Varicella vaccine given: immune Tdap vaccine given in AP or PP setting: declined Flu vaccine given in AP or PP setting: declined  Contraception: Depo (given prior to D/C)  Prenatal Labs:  Blood type/Rh O Pos  Antibody screen neg  Rubella Immune  Varicella Immune  RPR NR  HBsAg Neg  HIV NR  GC neg  Chlamydia neg  Genetic screening cfDNA negative   1 hour GTT 108  3 hour GTT N/A  GBS Pos     Plan:  Karen Lucero was discharged to home in good condition. Follow-up appointment with delivering provider in 2 weeks.  Discharge Medications: Allergies as of 09/10/2021   Not on File      Medication  List     TAKE these medications    acetaminophen 325 MG tablet Commonly known as: Tylenol Take 2 tablets (650 mg total) by mouth every 4 (four) hours as needed (for pain scale < 4).   benzocaine-Menthol 20-0.5 % Aero Commonly known as: DERMOPLAST Apply 1 application topically as needed for irritation (perineal discomfort).   ferrous sulfate 325 (65 FE) MG  tablet Take 1 tablet by mouth daily with breakfast.   ibuprofen 600 MG tablet Commonly known as: ADVIL Take 1 tablet (600 mg total) by mouth every 6 (six) hours.   PNV Prenatal Plus Multivitamin 27-1 MG Tabs Take 1 tablet by mouth daily.   witch hazel-glycerin pad Commonly known as: TUCKS Apply 1 application topically as needed for hemorrhoids.         Follow-up Information     [name], CNM. Go on 09/23/2021.   Specialty: Certified Nurse Midwife Why: Follow-up appointment with [name], CNM at [facility]: Tuesday, 11/1 at 8:45am! Contact information: [address] [place] [address] [phone]                 Signed:  Janyce Llanos, CNM 09/10/2021  11:45 AM

## 2021-09-09 NOTE — Progress Notes (Signed)
Labor Progress Note  Karen Lucero is a 20 y.o. G1P0000 at [redacted]w[redacted]d by ultrasound at [redacted]w[redacted]d, admitted for IOL for IUGR.   Subjective: comfortable now after epidural.   Objective: BP (!) 106/48   Pulse 70   Temp 97.6 F (36.4 C) (Oral)   Resp 16   Ht 5\' 3"  (1.6 m)   Wt 62.6 kg   LMP 12/03/2020 (Exact Date)   SpO2 95%   BMI 24.45 kg/m  Notable VS details: reviewed.   General: no acute distress.  Abdomen: soft, gravid, non-tender;  EFW: 5.12lbs   Fetal Assessment: FHT:  FHR: 130 bpm, variability: moderate,  accelerations:  Present,  decelerations:  Absent Category/reactivity:  Category I UC:   q 1.5-3 min, palpate moderate.  SVE: 1/60/-1  Membrane status: SROM clear fluid at 1100   Labs: Lab Results  Component Value Date   WBC 7.5 09/09/2021   HGB 10.4 (L) 09/09/2021   HCT 30.4 (L) 09/09/2021   MCV 77.9 (L) 09/09/2021   PLT 181 09/09/2021    Assessment / Plan: G1p0 at 40.0wks, IOL for IUGR  Labor: s/p cytotec x 2 doses, currently on Pitocin and s/p SROM.  Preeclampsia:  n/a Fetal Wellbeing:  Category I Pain Control:  Epidural I/D:  GBS pos- plan to start Abx now Anticipated MOD:  NSVD  09/11/2021 Karen Lucero, CNM 09/09/2021, 12:10 PM

## 2021-09-09 NOTE — Anesthesia Preprocedure Evaluation (Signed)
Anesthesia Evaluation  Patient identified by MRN, date of birth, ID band Patient awake    Reviewed: Allergy & Precautions, H&P , NPO status , Patient's Chart, lab work & pertinent test results  Airway Mallampati: III  TM Distance: >3 FB Neck ROM: full    Dental  (+) Dental Advidsory Given   Pulmonary neg pulmonary ROS,           Cardiovascular negative cardio ROS       Neuro/Psych  Headaches, negative psych ROS   GI/Hepatic Neg liver ROS, GERD  ,  Endo/Other  negative endocrine ROS  Renal/GU negative Renal ROS  negative genitourinary   Musculoskeletal   Abdominal   Peds  Hematology  (+) Blood dyscrasia, anemia ,   Anesthesia Other Findings   Reproductive/Obstetrics (+) Pregnancy and Breast feeding                              Anesthesia Physical Anesthesia Plan  ASA: 2  Anesthesia Plan: Epidural   Post-op Pain Management:    Induction:   PONV Risk Score and Plan:   Airway Management Planned:   Additional Equipment:   Intra-op Plan:   Post-operative Plan:   Informed Consent: I have reviewed the patients History and Physical, chart, labs and discussed the procedure including the risks, benefits and alternatives for the proposed anesthesia with the patient or authorized representative who has indicated his/her understanding and acceptance.       Plan Discussed with: CRNA  Anesthesia Plan Comments:         Anesthesia Quick Evaluation

## 2021-09-09 NOTE — Progress Notes (Signed)
Labor Progress Note  Karen Lucero is a 20 y.o. G1P0000 at [redacted]w[redacted]d by ultrasound at [redacted]w[redacted]d, admitted for IOL for IUGR.   Subjective: feeling painful midline lower abdomen contraction pain. Requesting more IVPM.   Objective: BP 115/74   Pulse 71   Temp 97.7 F (36.5 C) (Oral)   Resp 16   Ht 5\' 3"  (1.6 m)   Wt 62.6 kg   LMP 12/03/2020 (Exact Date)   BMI 24.45 kg/m  Notable VS details: reviewed.   General: no acute distress.  Abdomen: soft, gravid, non-tender;  EFW: 5.12lbs   Fetal Assessment: FHT:  FHR: 120 bpm, variability: moderate,  accelerations:  Present,  decelerations:  Absent Category/reactivity:  Category I UC:   q1-37min, palpate moderate.  SVE:   FTP/50/-1, soft/midposition. Small bloody show noted.   Membrane status: intact Amniotic color: n/a  Labs: Lab Results  Component Value Date   WBC 7.5 09/09/2021   HGB 10.4 (L) 09/09/2021   HCT 30.4 (L) 09/09/2021   MCV 77.9 (L) 09/09/2021   PLT 181 09/09/2021    Assessment / Plan: G1p0 at 40.0wks, IOL for IUGR  Labor: s/p cytotec x 2 doses, will re-assess at 4hrs after last dose, determine repeat Cytotec or start Pitocin. Plan foley balloon or Cook cath once cervix is adequately dilated.  Preeclampsia:  n/a Fetal Wellbeing:  Category I Pain Control:  IV pain meds I/D:  GBS pos- plan to start Abx with ROM or active labor.  Anticipated MOD:  NSVD  09/11/2021, CNM 09/09/2021, 8:28 AM

## 2021-09-09 NOTE — Progress Notes (Signed)
Labor Progress Note  Karen Lucero is a 20 y.o. G1P0000 at [redacted]w[redacted]d by ultrasound at [redacted]w[redacted]d, admitted for IOL for IUGR.   Subjective: comfortable now after epidural. Feeling a little pressure.   Objective: BP 118/71   Pulse (!) 104   Temp 98.3 F (36.8 C) (Axillary)   Resp 16   Ht 5\' 3"  (1.6 m)   Wt 62.6 kg   LMP 12/03/2020 (Exact Date)   SpO2 95%   BMI 24.45 kg/m  Notable VS details: reviewed.   General: no acute distress.  Abdomen: soft, gravid, non-tender;  EFW: 5.12lbs   Fetal Assessment: FHT:  FHR: 150 bpm, variability: moderate,  accelerations:  Present,  decelerations:  Absent Category/reactivity:  Category I UC:   q 1.5-2 min, palpate moderate. Pitocin at  7mu SVE: 5/90/0  Membrane status: SROM clear fluid at 1100   Labs: Lab Results  Component Value Date   WBC 7.5 09/09/2021   HGB 10.4 (L) 09/09/2021   HCT 30.4 (L) 09/09/2021   MCV 77.9 (L) 09/09/2021   PLT 181 09/09/2021    Assessment / Plan: G1p0 at 40.0wks, IOL for IUGR  Labor: s/p cytotec x 2 doses, currently on Pitocin and s/p SROM. Cervix changing Preeclampsia:  n/a Fetal Wellbeing:  Category I Pain Control:  Epidural I/D:  GBS pos, PCN x 2 doses Anticipated MOD:  NSVD  09/11/2021, CNM 09/09/2021, 6:42 PM

## 2021-09-10 LAB — CBC
HCT: 31.6 % — ABNORMAL LOW (ref 36.0–46.0)
Hemoglobin: 10.7 g/dL — ABNORMAL LOW (ref 12.0–15.0)
MCH: 26.6 pg (ref 26.0–34.0)
MCHC: 33.9 g/dL (ref 30.0–36.0)
MCV: 78.4 fL — ABNORMAL LOW (ref 80.0–100.0)
Platelets: 169 10*3/uL (ref 150–400)
RBC: 4.03 MIL/uL (ref 3.87–5.11)
RDW: 16.5 % — ABNORMAL HIGH (ref 11.5–15.5)
WBC: 14.2 10*3/uL — ABNORMAL HIGH (ref 4.0–10.5)
nRBC: 0 % (ref 0.0–0.2)

## 2021-09-10 MED ORDER — ACETAMINOPHEN 325 MG PO TABS: 650.0000 mg | ORAL_TABLET | ORAL | Status: AC | PRN

## 2021-09-10 MED ORDER — MEDROXYPROGESTERONE ACETATE 150 MG/ML IM SUSP
150.0000 mg | INTRAMUSCULAR | Status: DC
  Filled 2021-09-10: qty 1

## 2021-09-10 MED ORDER — IBUPROFEN 600 MG PO TABS: 600.0000 mg | ORAL_TABLET | Freq: Four times a day (QID) | ORAL | 0 refills | Status: AC

## 2021-09-10 MED ORDER — IBUPROFEN 600 MG PO TABS
600.0000 mg | ORAL_TABLET | Freq: Four times a day (QID) | ORAL | Status: DC
  Filled 2021-09-10: qty 1

## 2021-09-10 MED ORDER — BENZOCAINE-MENTHOL 20-0.5 % EX AERO: 1.0000 "application " | INHALATION_SPRAY | CUTANEOUS | Status: AC | PRN

## 2021-09-10 MED ORDER — WITCH HAZEL-GLYCERIN EX PADS: 1.0000 "application " | MEDICATED_PAD | CUTANEOUS | 12 refills | Status: AC | PRN

## 2021-09-10 NOTE — TOC Initial Note (Signed)
Transition of Care [facility]) - Initial/Assessment Note    Patient Details  Name: Karen Lucero MRN: [id number] Date of Birth: [birth year]  [facility]) CM/SW Contact:    Hetty Ely, RN Phone Number: 09/10/2021, 10:59 AM  Clinical Narrative: Spoke with MOB at the bedside, newborn in bassinet asleep. MOB says she has everything needed for baby, lives safely with FOB and fraternal grandparents. MOB says she will breast and bottle feed, WIC caseworker in place, DSS services in place received food stamps. MOB says she has no needs at this time. No TOC needs assessed, can discharge when medically stable.                   Patient Goals and CMS Choice        Expected Discharge Plan and Services                                                Prior Living Arrangements/Services                       Activities of Daily Living Home Assistive Devices/Equipment: None ADL Screening (condition at time of admission) Patient's cognitive ability adequate to safely complete daily activities?: Yes Is the patient deaf or have difficulty hearing?: No Does the patient have difficulty seeing, even when wearing glasses/contacts?: No Does the patient have difficulty concentrating, remembering, or making decisions?: No Patient able to express need for assistance with ADLs?: Yes Does the patient have difficulty dressing or bathing?: No Independently performs ADLs?: Yes (appropriate for developmental age) Does the patient have difficulty walking or climbing stairs?: Yes Weakness of Legs: None Weakness of Arms/Hands: None  Permission Sought/Granted                  Emotional Assessment              Admission diagnosis:  IUGR (intrauterine growth restriction) affecting care of mother, third trimester, fetus 1 [O36.5931] Patient Active Problem List   Diagnosis Date Noted   IUGR (intrauterine growth restriction) affecting care of mother, third trimester,  fetus 1 09/09/2021   Anemia in pregnancy, third trimester 09/02/2021   IUGR (intrauterine growth restriction) affecting care of mother 09/02/2021   IDA (iron deficiency anemia) 07/15/2021   Supervision of high risk pregnancy, antepartum 01/24/2021   PCP:  Pcp, No Pharmacy:   [id number] - [place], [place] - [address] [place] [address] Phone: [phone] Fax: [phone]     Social Determinants of Health (SDOH) Interventions    Readmission Risk Interventions No flowsheet data found.

## 2021-09-10 NOTE — Progress Notes (Signed)
Patient discharged home with infant. Discharge instructions, prescriptions, and follow-up appointment were given to and reviewed with patient by Isidoro Donning, RN. Patient verbalized understanding. Escorted out by auxiliary.

## 2021-09-10 NOTE — Discharge Instructions (Signed)
Discharge Instructions:   Follow-up appointment with Karen Lucero, CNM at [facility]: Tuesday, 11/1 at 8:45am!  If there are any new medications, they have been ordered and will be available for pickup at the listed pharmacy on your way home from the hospital.   Call office if you have any of the following: headache, visual changes, fever >101.0 F, chills, shortness of breath, breast concerns, excessive vaginal bleeding, incision drainage or problems, leg pain or redness, depression or any other concerns. If you have vaginal discharge with an odor, let your doctor know.   It is normal to bleed for up to 6 weeks. You should not soak through more than 1 pad in 1 hour. If you have a blood clot larger than your fist with continued bleeding, call your doctor.   Activity: Do not lift > 10 lbs for 6 weeks (do not lift anything heavier than your baby). No intercourse, tampons, swimming pools, hot tubs, baths (only showers) for 6 weeks.  No driving for 1-2 weeks. Continue prenatal vitamin, especially if breastfeeding. Increase calories and fluids (water) while breastfeeding.   Your milk will come in, in the next couple of days (right now it is colostrum). You may have a slight fever when your milk comes in, but it should go away on its own.  If it does not, and rises above 101 F please call the doctor. You will also feel achy and your breasts will be firm. They will also start to leak. If you are breastfeeding, continue as you have been and you can pump/express milk for comfort.   If you have too much milk, your breasts can become engorged, which could lead to mastitis. This is an infection of the milk ducts. It can be very painful and you will need to notify your doctor to obtain a prescription for antibiotics. You can also treat it with a shower or hot/cold compress.   For concerns about your baby, please call your pediatrician.  For breastfeeding concerns, the lactation consultant can be  reached at [phone].   Postpartum blues (feelings of happy one minute and sad another minute) are normal for the first few weeks but if it gets worse let your doctor know.   Congratulations! We enjoyed caring for you and your new bundle of joy!

## 2021-09-10 NOTE — Anesthesia Postprocedure Evaluation (Signed)
Anesthesia Post Note  Patient: Karen Lucero  Procedure(s) Performed: AN AD HOC LABOR EPIDURAL  Patient location during evaluation: Mother Baby Anesthesia Type: Epidural Level of consciousness: awake and alert Pain management: pain level controlled Vital Signs Assessment: post-procedure vital signs reviewed and stable Respiratory status: spontaneous breathing, nonlabored ventilation and respiratory function stable Cardiovascular status: stable Postop Assessment: no headache, no backache and epidural receding Anesthetic complications: no   No notable events documented.   Last Vitals:  Vitals:   09/09/21 2255 09/10/21 0332  BP: (!) 101/55 106/63  Pulse: 81 (!) 58  Resp: 18 17  Temp: 36.8 C 36.5 C  SpO2: 100% 100%    Last Pain:  Vitals:   09/10/21 0332  TempSrc: Oral  PainSc:                  Karoline Caldwell

## 2021-09-10 NOTE — Progress Notes (Addendum)
Addendum: 10/19/202211:39 AM She does desire discharge home today once 24hrs postpartum. She was seen by Case Manager who reports she is cleared to be discharged home. She has good family support, breastfeeding is going well. Will discharge home today.  Post Partum Day 1 Subjective: Doing well, no complaints.  Tolerating regular diet, pain with PO meds, voiding and ambulating without difficulty.  No CP SOB Fever,Chills, N/V or leg pain; denies nipple or breast pain, no HA change of vision, RUQ/epigastric pain  Objective: BP 106/63 (BP Location: Left Arm)   Pulse (!) 58   Temp 97.7 F (36.5 C) (Oral)   Resp 17   Ht 5\' 3"  (1.6 m)   Wt 62.6 kg   LMP 12/03/2020 (Exact Date)   SpO2 100%   Breastfeeding Unknown   BMI 24.45 kg/m    Physical Exam:  General: NAD Breasts: soft/nontender, no nipple abrasions/excoriation/bruising no engorgement,  CV: RRR Pulm: nl effort, CTABL Abdomen: soft, NT, BS x 4 Perineum: minimal edema, intact Lochia: moderate Uterine Fundus: fundus firm and 1 fb below umbilicus DVT Evaluation: no cords, ttp LEs   Recent Labs    09/09/21 0041 09/10/21 0449  HGB 10.4* 10.7*  HCT 30.4* 31.6*  WBC 7.5 14.2*  PLT 181 169    Assessment/Plan: 20 y.o. G1P1001 postpartum day # 1  - Continue routine PP care - Lactation consult  - Discussed contraceptive options including implant, IUDs hormonal and non-hormonal, injection, pills/ring/patch, condoms, and NFP. She desires Depo prior to d/c. - Anemia - hemodynamically stable and asymptomatic; start po ferrous sulfate BID with stool softeners   Disposition: Does not desire Dc home today.   09/12/21, CNM 09/10/2021 8:51 AM

## 2021-09-10 NOTE — Lactation Note (Signed)
This note was copied from a baby's chart. Lactation Consultation Note  Patient Name: Girl Shalynn Jorstad [id number] Date: 09/10/2021 Reason for consult: Initial assessment;Primapara;Term Age:20 years  Initial lactation visit. Mom is P1, SVD 15hrs ago. Mom noted breastfeeding as feeding choice on admission, but has provided bottles. Mom had just completed a breastfed when Lactation team entered. Mom reports she primarily is breastfeeding, using formula as needed, and plans to start using the pump that was set-up for her at bedside.  LC provided guidance and encouragement of baby at breast for feedings, discussed consistency of colostrum, baby's effectiveness of removal versus the pump, and the importance of stimulation for milk supply growth. Re-educated mom on the use of the pump, and discussed pump use as needed. Mom reports that baby struggles with latching onto R breast. Provided reassurance that this can be normal, tips given for breast acceptance, and options of stimulation through pumping if baby continues to refuse. Encouraged to feed with cues and not the clock and monitor output.  Whiteboard updated with LC name/number; encouraged to call with questions and for ongoing support.  Maternal Data Does the patient have breastfeeding experience prior to this delivery?: No  Feeding Mother's Current Feeding Choice: Breast Milk  LATCH Score Latch:  (Baby just finished a feeding)                  Lactation Tools Discussed/Used Tools: Pump Breast pump type: Double-Electric Breast Pump Pump Education: Setup, frequency, and cleaning (set-up earlier by RN, reviewed again by [facility]) Reason for Pumping: mom's choice Pumping frequency: PRN  Interventions Interventions: Breast feeding basics reviewed;Education;DEBP  Discharge    Consult Status Consult Status: Follow-up Date: 09/10/21 Follow-up type: In-patient    Danford Bad 09/10/2021, 10:42 AM

## 2021-11-26 ENCOUNTER — Ambulatory Visit: Payer: Medicaid Other

## 2021-11-26 ENCOUNTER — Other Ambulatory Visit: Payer: Self-pay

## 2021-12-05 ENCOUNTER — Ambulatory Visit: Payer: Medicaid Other

## 2021-12-16 ENCOUNTER — Ambulatory Visit: Payer: Medicaid Other

## 2022-01-23 ENCOUNTER — Emergency Department
Admission: EM | Admit: 2022-01-23 | Discharge: 2022-01-23 | Disposition: A | Discharge status: Home / Self Care | Payer: Medicaid Other | Attending: Emergency Medicine | Admitting: Emergency Medicine

## 2022-01-23 ENCOUNTER — Other Ambulatory Visit: Payer: Self-pay

## 2022-01-23 DIAGNOSIS — O26891 Other specified pregnancy related conditions, first trimester: Secondary | ICD-10-CM | POA: Diagnosis not present

## 2022-01-23 DIAGNOSIS — R1030 Lower abdominal pain, unspecified: Secondary | ICD-10-CM | POA: Diagnosis not present

## 2022-01-23 DIAGNOSIS — Z5321 Procedure and treatment not carried out due to patient leaving prior to being seen by health care provider: Secondary | ICD-10-CM | POA: Insufficient documentation

## 2022-01-23 DIAGNOSIS — O219 Vomiting of pregnancy, unspecified: Secondary | ICD-10-CM | POA: Diagnosis present

## 2022-01-23 DIAGNOSIS — Z3A00 Weeks of gestation of pregnancy not specified: Secondary | ICD-10-CM | POA: Insufficient documentation

## 2022-01-23 LAB — URINALYSIS, ROUTINE W REFLEX MICROSCOPIC
Bacteria, UA: NONE SEEN
Bilirubin Urine: NEGATIVE
Glucose, UA: NEGATIVE mg/dL
Hgb urine dipstick: NEGATIVE
Ketones, ur: NEGATIVE mg/dL
Nitrite: NEGATIVE
Protein, ur: NEGATIVE mg/dL
Specific Gravity, Urine: 1.023 (ref 1.005–1.030)
pH: 6 (ref 5.0–8.0)

## 2022-01-23 LAB — COMPREHENSIVE METABOLIC PANEL
ALT: 32 U/L (ref 0–44)
AST: 21 U/L (ref 15–41)
Albumin: 4 g/dL (ref 3.5–5.0)
Alkaline Phosphatase: 37 U/L — ABNORMAL LOW (ref 38–126)
Anion gap: 9 (ref 5–15)
BUN: 10 mg/dL (ref 6–20)
CO2: 22 mmol/L (ref 22–32)
Calcium: 9.5 mg/dL (ref 8.9–10.3)
Chloride: 104 mmol/L (ref 98–111)
Creatinine, Ser: 0.56 mg/dL (ref 0.44–1.00)
GFR, Estimated: >60 mL/min (ref >60)
Glucose, Bld: 103 mg/dL — ABNORMAL HIGH (ref 70–99)
Potassium: 3.5 mmol/L (ref 3.5–5.1)
Sodium: 135 mmol/L (ref 135–145)
Total Bilirubin: 0.3 mg/dL (ref 0.3–1.2)
Total Protein: 7.4 g/dL (ref 6.5–8.1)

## 2022-01-23 LAB — CBC
HCT: 38.9 % (ref 36.0–46.0)
Hemoglobin: 12.8 g/dL (ref 12.0–15.0)
MCH: 25.3 pg — ABNORMAL LOW (ref 26.0–34.0)
MCHC: 32.9 g/dL (ref 30.0–36.0)
MCV: 76.9 fL — ABNORMAL LOW (ref 80.0–100.0)
Platelets: 262 10*3/uL (ref 150–400)
RBC: 5.06 MIL/uL (ref 3.87–5.11)
RDW: 13.6 % (ref 11.5–15.5)
WBC: 7.7 10*3/uL (ref 4.0–10.5)
nRBC: 0 % (ref 0.0–0.2)

## 2022-01-23 LAB — HCG, QUANTITATIVE, PREGNANCY: hCG, Beta Chain, Quant, S: 38922 m[IU]/mL — ABNORMAL HIGH (ref <5)

## 2022-01-23 LAB — POC URINE PREG, ED: Preg Test, Ur: POSITIVE — AB

## 2022-01-23 LAB — LIPASE, BLOOD: Lipase: 26 U/L (ref 11–51)

## 2022-01-23 NOTE — ED Triage Notes (Signed)
Patient to ER via POV with complaints of nausea and vomiting. Reports symptoms have been ongoing for three days. States she had a positive pregnancy test today. States recent pregnancy in October. LMP December. Some lower abdominal cramping. Denies vaginal discharge or bleeding. ?

## 2022-01-23 NOTE — ED Notes (Signed)
Pt states she is leaving, she will come back on Monday, does not wish to wait any longer ?

## 2022-09-26 IMAGING — CR DG CHEST 2V
1 series · 2 of 2 positions shown · non-contrast
Comparison: None.

CLINICAL DATA: Cough, fever

EXAM:
CHEST - 2 VIEW

[Series 1: dg chest 2 view · 0.14mm/px · 2 of 2 slices shown]
[im 1/2]
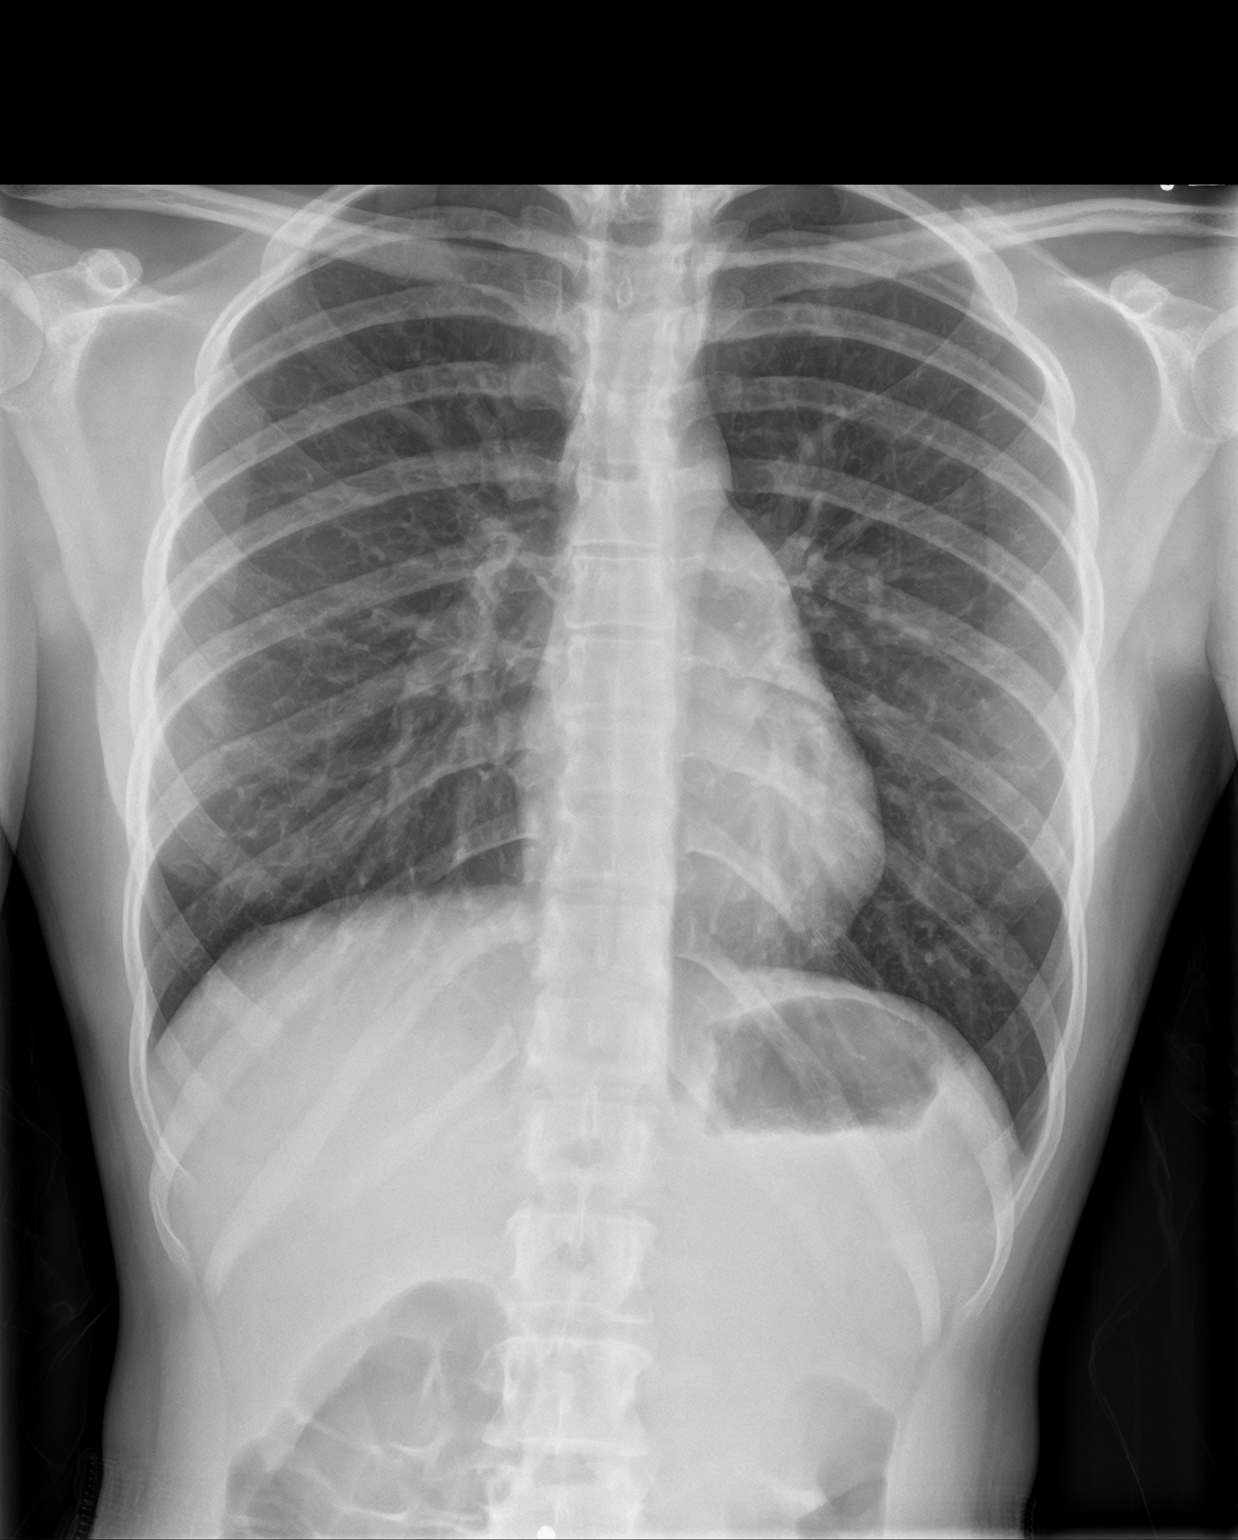
[im 2/2]
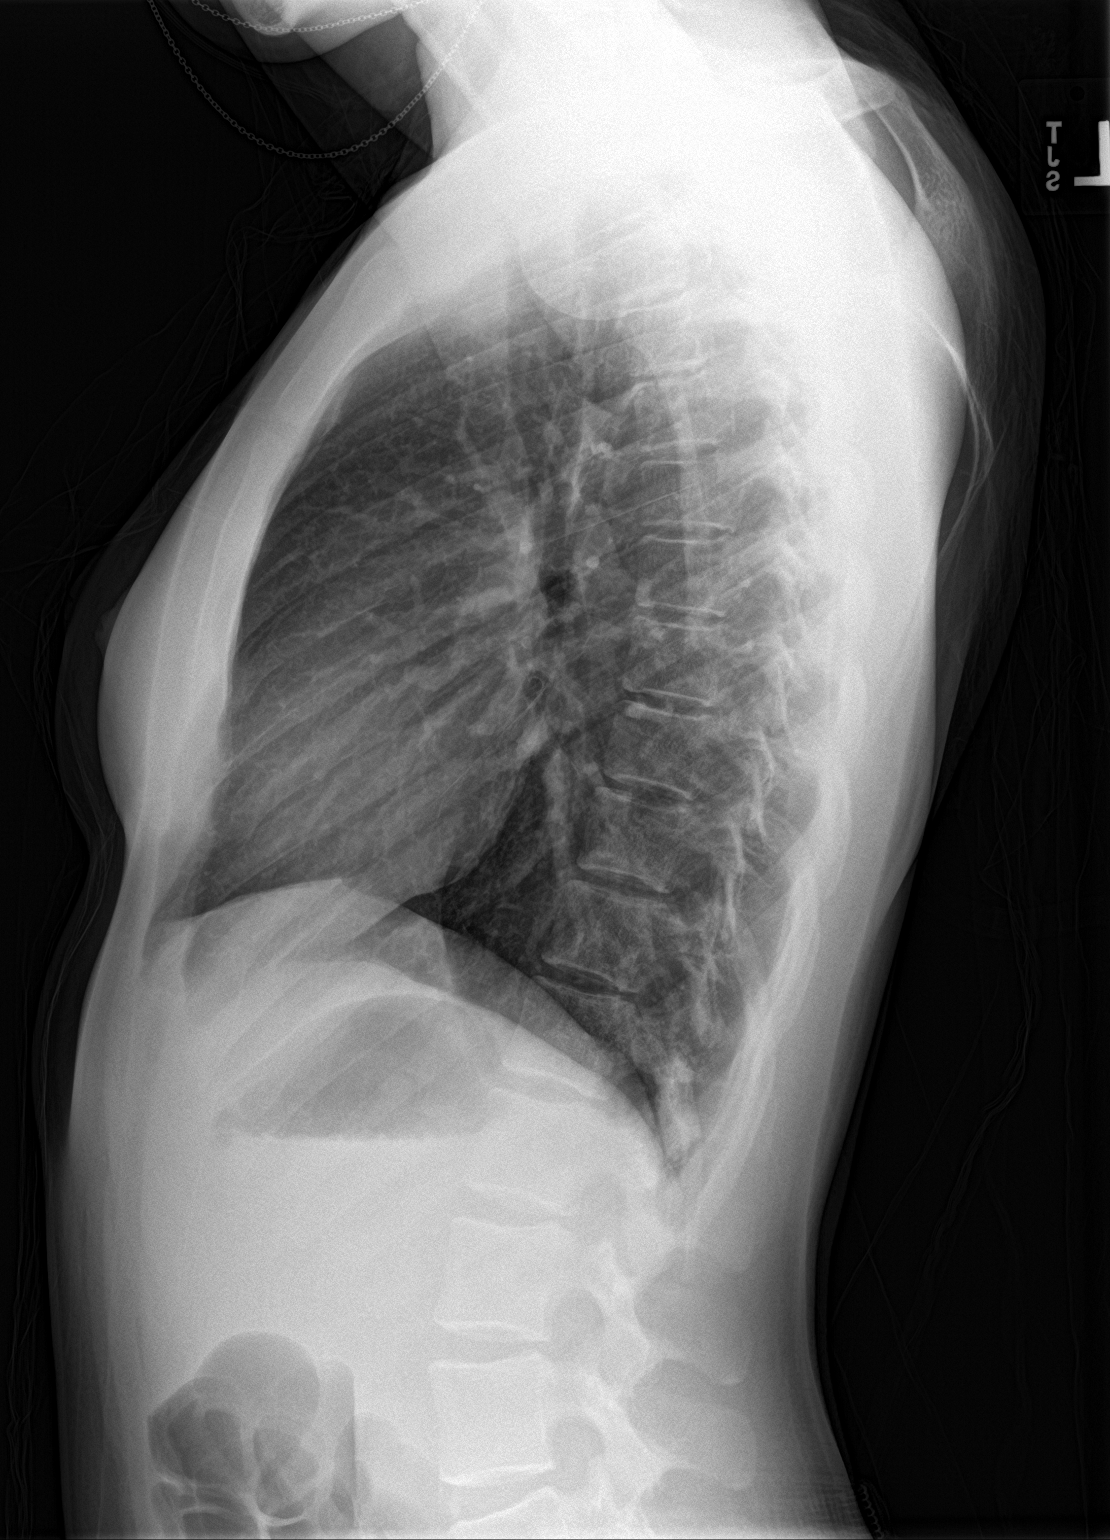

[2 of 2 positions shown; findings below may reference images not displayed]

FINDINGS: The heart size and mediastinal contours are within normal limits. No
focal airspace consolidation, pleural effusion, or pneumothorax. The
visualized skeletal structures are unremarkable.
IMPRESSION: No active cardiopulmonary disease.

## 2022-10-07 ENCOUNTER — Ambulatory Visit: Payer: Medicaid Other

## 2022-10-07 ENCOUNTER — Ambulatory Visit (LOCAL_COMMUNITY_HEALTH_CENTER): Payer: Medicaid Other | Admitting: Family Medicine

## 2022-10-07 VITALS — BP 117/78 | HR 96 | Ht 64.0 in | Wt 107.6 lb

## 2022-10-07 DIAGNOSIS — Z3009 Encounter for other general counseling and advice on contraception: Secondary | ICD-10-CM

## 2022-10-07 DIAGNOSIS — Z30017 Encounter for initial prescription of implantable subdermal contraceptive: Secondary | ICD-10-CM | POA: Diagnosis not present

## 2022-10-07 DIAGNOSIS — N760 Acute vaginitis: Secondary | ICD-10-CM

## 2022-10-07 DIAGNOSIS — Z113 Encounter for screening for infections with a predominantly sexual mode of transmission: Secondary | ICD-10-CM

## 2022-10-07 DIAGNOSIS — Z309 Encounter for contraceptive management, unspecified: Secondary | ICD-10-CM

## 2022-10-07 DIAGNOSIS — Z01419 Encounter for gynecological examination (general) (routine) without abnormal findings: Secondary | ICD-10-CM

## 2022-10-07 LAB — WET PREP FOR TRICH, YEAST, CLUE
Trichomonas Exam: NEGATIVE
Yeast Exam: NEGATIVE

## 2022-10-07 MED ORDER — METRONIDAZOLE 500 MG PO TABS: 500.0000 mg | ORAL_TABLET | Freq: Two times a day (BID) | ORAL | 0 refills | Status: AC

## 2022-10-07 MED ORDER — ETONOGESTREL 68 MG ~~LOC~~ IMPL
68.0000 mg | DRUG_IMPLANT | Freq: Once | SUBCUTANEOUS | Status: AC
  Administered 2022-10-07: 68 mg via SUBCUTANEOUS

## 2022-10-07 MED ORDER — LEVONORGESTREL 1.5 MG PO TABS: 1.5000 mg | ORAL_TABLET | Freq: Once | ORAL | 0 refills | Status: AC
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
## 2022-10-09 NOTE — Progress Notes (Signed)
Attestation of Attending Supervision of Advanced Practitioner (CNM/PA/NP): Evaluation and management procedures were performed by the Advanced Practice Provider under my supervision and collaboration.  I have reviewed the Advanced Practice Provider's note and chart, and I agree with the management and plan. I have also made any necessary editorial changes. Dameon Soltis, FNP     

## 2022-10-14 LAB — IGP, RFX APTIMA HPV ASCU: PAP Smear Comment: 0

## 2022-10-19 ENCOUNTER — Other Ambulatory Visit: Payer: Self-pay

## 2022-10-19 ENCOUNTER — Emergency Department
Admission: EM | Admit: 2022-10-19 | Discharge: 2022-10-19 | Disposition: A | Discharge status: Home / Self Care | Payer: Medicaid Other | Attending: Student in an Organized Health Care Education/Training Program | Admitting: Student in an Organized Health Care Education/Training Program

## 2022-10-19 ENCOUNTER — Encounter: Payer: Self-pay | Admitting: *Deleted

## 2022-10-19 ENCOUNTER — Telehealth: Payer: Self-pay

## 2022-10-19 DIAGNOSIS — Z20822 Contact with and (suspected) exposure to covid-19: Secondary | ICD-10-CM | POA: Diagnosis not present

## 2022-10-19 DIAGNOSIS — J029 Acute pharyngitis, unspecified: Secondary | ICD-10-CM | POA: Diagnosis present

## 2022-10-19 LAB — RESP PANEL BY RT-PCR (FLU A&B, COVID) ARPGX2
Influenza A by PCR: NEGATIVE
Influenza B by PCR: NEGATIVE
SARS Coronavirus 2 by RT PCR: NEGATIVE

## 2022-10-19 LAB — GROUP A STREP BY PCR: Group A Strep by PCR: NOT DETECTED

## 2022-10-19 MED ORDER — DEXAMETHASONE 10 MG/ML FOR PEDIATRIC ORAL USE
10.0000 mg | Freq: Once | INTRAMUSCULAR | Status: AC
Start: 2022-10-19 — End: 2022-10-19
  Administered 2022-10-19: 10 mg via ORAL
  Filled 2022-10-19: qty 1

## 2022-10-19 MED ORDER — IBUPROFEN 600 MG PO TABS
600.0000 mg | ORAL_TABLET | Freq: Once | ORAL | Status: AC
  Administered 2022-10-19: 600 mg via ORAL
  Filled 2022-10-19: qty 1

## 2022-10-19 NOTE — Telephone Encounter (Signed)
Calling pt regarding pap results from 10/07/22 specimen, and colpo referral by provider. ATYPICAL SQUAMOUS CELLS, CANNOT EXCLUDE HIGH-GRADE SQUAMOUS INTRAEPITHELIAL LESION (ASC-H). No HPV. Mail pap card 10/20/22.  Confirm insurance status and where pt wants to go for colpo.

## 2022-10-19 NOTE — ED Provider Notes (Signed)
[facility] Provider Note    Event Date/Time   First MD Initiated Contact with Patient 10/19/22 1621     (approximate)   History   Sore Throat   HPI  Karen Lucero is a 21 y.o. female presents to the ER for evaluation of progressively worsening sore throat and trouble swallowing due to discomfort.  No cough.  Denies any shortness of breath.  Is having some chills.  Has taken some Tylenol without improvement.     Physical Exam   Triage Vital Signs: ED Triage Vitals  Enc Vitals Group     BP 10/19/22 1525 104/72     Pulse Rate 10/19/22 1525 100     Resp 10/19/22 1525 18     Temp 10/19/22 1525 98.6 F (37 C)     Temp Source 10/19/22 1525 Oral     SpO2 10/19/22 1525 97 %     Weight 10/19/22 1522 107 lb (48.5 kg)     Height 10/19/22 1522 5\' 3"  (1.6 m)     Head Circumference --      Peak Flow --      Pain Score 10/19/22 1522 9     Pain Loc --      Pain Edu? --      Excl. in GC? --     Most recent vital signs: Vitals:   10/19/22 1525  BP: 104/72  Pulse: 100  Resp: 18  Temp: 98.6 F (37 C)  SpO2: 97%     Constitutional: Alert  Eyes: Conjunctivae are normal.  Head: Atraumatic. Nose: No congestion/rhinnorhea. Mouth/Throat: Mucous membranes are moist.  Uvula midline.  Does have bilateral tonsillar erythema no exudates.  No stridor no muffled voice.  No trismus. Neck: Painless ROM.  Cardiovascular:   Good peripheral circulation. Respiratory: Normal respiratory effort.  No retractions.  Gastrointestinal: Soft and nontender.  Musculoskeletal:  no deformity Neurologic:  MAE spontaneously. No gross focal neurologic deficits are appreciated.  Skin:  Skin is warm, dry and intact. No rash noted. Psychiatric: Mood and affect are normal. Speech and behavior are normal.    ED Results / Procedures / Treatments   Labs (all labs ordered are listed, but only abnormal results are displayed) Labs Reviewed  GROUP A STREP BY PCR  RESP PANEL BY  RT-PCR (FLU A&B, COVID) [id number]     EKG     RADIOLOGY    PROCEDURES:  Critical Care performed:   Procedures   MEDICATIONS ORDERED IN ED: Medications  dexamethasone (DECADRON) 10 MG/ML injection for Pediatric ORAL use 10 mg (10 mg Oral Given 10/19/22 1649)  ibuprofen (ADVIL) tablet 600 mg (600 mg Oral Given 10/19/22 1650)     IMPRESSION / MDM / ASSESSMENT AND PLAN / ED COURSE  I reviewed the triage vital signs and the nursing notes.                              Differential diagnosis includes, but is not limited to, uri, strep throat, pharyngitis, pta, rpa  Patient presents to the ER for evaluation of symptoms as described above.  She is well-appearing nontoxic is not consistent with RPA or PTA.  Does have evidence of acute pharyngitis.  Will send for swab as she is to by [facility] criteria.  Do not feel that imaging clinically indicated.  Will give Decadron as well as Motrin.  Strep test is negative.  Less likely viral pharyngitis.  Discussed supportive  care.  Discussed signs symptoms which patient should return to the ER.  Patient agreeable plan.   FINAL CLINICAL IMPRESSION(S) / ED DIAGNOSES   Final diagnoses:  Pharyngitis, unspecified etiology     Rx / DC Orders   ED Discharge Orders     None        Note:  This document was prepared using Dragon voice recognition software and may include unintentional dictation errors.    Merlyn Lot, MD 10/19/22 [phone]

## 2022-10-19 NOTE — ED Triage Notes (Signed)
Pt reports fever and sore throat since yesterday.  Pt reports pain when swallowing.  Pt alert  speech clear.

## 2022-10-23 NOTE — Telephone Encounter (Signed)
Phone call to [phone], listed as pt's home and mobile number in chart. Female answered phone. Female stated pt lives with her and this is a good number for pt. Female stated that she would have [place] call me, Ulyess Blossom with doctor's office, at [phone].

## 2022-10-23 NOTE — Telephone Encounter (Addendum)
Pt returned phone call from [phone]. Confirmed identity. States to please update chart with this number also.  Leave the [phone] as the main number, further stated that the friend can also make appointments for her (like a mom, lives at her home).  Discussed pap results and colpo referral. Pt states she has Medicaid. Pt would like to be seen by [facility]. Pt counseled that they would be calling her to set up the colpo.

## 2022-10-26 NOTE — Telephone Encounter (Signed)
Referral to  OBGYN sent through Epic referral process.

## 2022-11-26 ENCOUNTER — Ambulatory Visit: Payer: Medicaid Other | Admitting: Obstetrics & Gynecology

## 2022-11-30 ENCOUNTER — Emergency Department
Admission: EM | Admit: 2022-11-30 | Discharge: 2022-11-30 | Disposition: A | Discharge status: Home / Self Care | Payer: Medicaid Other | Attending: Emergency Medicine | Admitting: Emergency Medicine

## 2022-11-30 ENCOUNTER — Other Ambulatory Visit: Payer: Self-pay

## 2022-11-30 DIAGNOSIS — T783XXA Angioneurotic edema, initial encounter: Secondary | ICD-10-CM | POA: Diagnosis not present

## 2022-11-30 DIAGNOSIS — R220 Localized swelling, mass and lump, head: Secondary | ICD-10-CM | POA: Diagnosis present

## 2022-11-30 LAB — POC URINE PREG, ED: Preg Test, Ur: NEGATIVE

## 2022-11-30 MED ORDER — FAMOTIDINE 20 MG PO TABS: 20.0000 mg | ORAL_TABLET | Freq: Two times a day (BID) | ORAL | 0 refills | Status: AC

## 2022-11-30 MED ORDER — DEXAMETHASONE SODIUM PHOSPHATE 10 MG/ML IJ SOLN
10.0000 mg | Freq: Once | INTRAMUSCULAR | Status: AC
  Administered 2022-11-30: 10 mg via INTRAMUSCULAR
  Filled 2022-11-30: qty 1

## 2022-11-30 MED ORDER — CEPHALEXIN 500 MG PO CAPS: 500.0000 mg | ORAL_CAPSULE | Freq: Three times a day (TID) | ORAL | 0 refills | Status: DC

## 2022-11-30 MED ORDER — PREDNISONE 10 MG PO TABS: ORAL_TABLET | ORAL | 0 refills | Status: AC

## 2022-11-30 MED ORDER — FAMOTIDINE 20 MG PO TABS
20.0000 mg | ORAL_TABLET | Freq: Once | ORAL | Status: AC
  Administered 2022-11-30: 20 mg via ORAL
  Filled 2022-11-30: qty 1

## 2022-11-30 NOTE — ED Provider Notes (Signed)
[facility] Provider Note    Event Date/Time   First MD Initiated Contact with Patient 11/30/22 1040     (approximate)   History   Oral Swelling   HPI  Karen Lucero is a 22 y.o. female presents to the ED with complaint of right upper lip edema that she noticed this morning.  Patient denies any new medications, creams, foods.  She denies any difficulty breathing or swallowing.  No medication has been taken this morning.      Physical Exam   Triage Vital Signs: ED Triage Vitals  Enc Vitals Group     BP 11/30/22 0854 105/64     Pulse --      Resp 11/30/22 0854 16     Temp 11/30/22 0854 98.2 F (36.8 C)     Temp Source 11/30/22 0854 Oral     SpO2 11/30/22 0854 100 %     Weight --      Height --      Head Circumference --      Peak Flow --      Pain Score 11/30/22 0845 0     Pain Loc --      Pain Edu? --      Excl. in [place]? --     Most recent vital signs: Vitals:   11/30/22 0854  BP: 105/64  Resp: 16  Temp: 98.2 F (36.8 C)  SpO2: 100%     General: Awake, no distress.  CV:  Good peripheral perfusion.  Resp:  Normal effort.  Lungs are clear bilaterally. Abd:  No distention.  Other:  Right upper lip is mildly edematous without erythema or skin discoloration.  Skin is intact.  No edema or injury noted to the gums or teeth.  Nontender to palpation.  Oropharynx is clear and without edema.  Patient is able to talk, swallow without any difficulties.   ED Results / Procedures / Treatments   Labs (all labs ordered are listed, but only abnormal results are displayed) Labs Reviewed  POC URINE PREG, ED       PROCEDURES:  Critical Care performed:   Procedures   MEDICATIONS ORDERED IN ED: Medications  dexamethasone (DECADRON) injection 10 mg (10 mg Intramuscular Given 11/30/22 1144)  famotidine (PEPCID) tablet 20 mg (20 mg Oral Given 11/30/22 1143)     IMPRESSION / MDM / ASSESSMENT AND PLAN / ED COURSE  I reviewed the triage vital  signs and the nursing notes.   Differential diagnosis includes, but is not limited to, angioedema, seasonal allergy, contact dermatitis, dental abscess.  22 year old female presents to the ED with complaint of right upper lip swelling that was noted this morning.  Patient was given Decadron 10 mg IM and Pepcid 20 mg p.o. while in the ED.  She was watched over period of time and there was no increased swelling and patient continued to talk and maintain secretions without any difficulty.  She denied any difficulty breathing and remained stable.  Patient was told to return to the emergency department if she developed any shortness of breath, difficulty breathing or swallowing.      Patient's presentation is most consistent with acute, uncomplicated illness.  FINAL CLINICAL IMPRESSION(S) / ED DIAGNOSES   Final diagnoses:  Angioedema, initial encounter     Rx / DC Orders   ED Discharge Orders          Ordered    cephALEXin (KEFLEX) 500 MG capsule  3 times daily,   Status:  Discontinued        11/30/22 1132    predniSONE (DELTASONE) 10 MG tablet        11/30/22 1201    famotidine (PEPCID) 20 MG tablet  2 times daily        11/30/22 1201             Note:  This document was prepared using Dragon voice recognition software and may include unintentional dictation errors.   Tommi Rumps, PA-C 11/30/22 1558    Phineas Semen, MD 12/05/22 [phone]

## 2022-11-30 NOTE — ED Notes (Signed)
Urine Preg NEGATIVE 

## 2022-11-30 NOTE — ED Triage Notes (Signed)
Pt comes with c/o lip swelling. Pt states she woke up this am and it was swollen and numb. Pt denies any new lotions, makeup soaps or any known food allergies. Pt denies any pain or burning.

## 2022-11-30 NOTE — Discharge Instructions (Addendum)
Return to the emergency if any severe worsening of your symptoms or difficulty swallowing.  A prescription for the same medications was sent to the pharmacy for you to continue taking.  The Pepcid will be twice a day which is an H2 blocker and the prednisone is a tapering dose starting with 6 tablets and working down to 1 tablet over the next 6 days.  You may also take Benadryl if needed for itching.  Be aware that the Benadryl could cause drowsiness and increase your risk for injury.  A list of clinics for you to obtain a primary care provider is on your discharge papers.  Also if this happens again you most likely will need to see an allergy specialist to see what is causing this.   Please go to the following website to schedule new (and existing) patient appointments:   [email or web address]   The following is a list of primary care offices in the area who are accepting new patients at this time.  Please reach out to one of them directly and let them know you would like to schedule an appointment to follow up on an Emergency Department visit, and/or to establish a new primary care provider (PCP).  There are likely other primary care clinics in the are who are accepting new patients, but this is an excellent place to start:  [facility] physician: Dr Lavon Paganini [address] [place], [place] [address] [phone]  [facility] Lead Physician: Dr Steele Sizer [address], [place], [place] [address] [phone]  [facility] Physician: Dr Park Liter [address] [place], [place] [address] [phone]  [facility] Lead Physician: Dr Dewaine Oats [address], [place], [place] [address] [phone]  [facility] Physician: Dr Halina Maidens [address] [place], [place], [place] [address] [phone]

## 2022-11-30 NOTE — ED Notes (Signed)
Right upper lip swelling today.  NO SOB/ DOE.  Managing secretions. Voice clear and strong.  NAD

## 2022-12-22 ENCOUNTER — Telehealth: Payer: Self-pay | Admitting: Obstetrics & Gynecology

## 2022-12-22 ENCOUNTER — Ambulatory Visit: Payer: Medicaid Other | Admitting: Obstetrics & Gynecology

## 2022-12-22 NOTE — Telephone Encounter (Signed)
Reached out to pt to reschedule procedure (colpo) that was scheduled for 12/22/2022 at 9:15 with Dr. Hulan Fray.  Called pt but did not reach her.  Person on phone said that she would have her call back.

## 2022-12-25 ENCOUNTER — Encounter: Payer: Self-pay | Admitting: Obstetrics & Gynecology

## 2022-12-25 NOTE — Telephone Encounter (Signed)
Reached out to pt (3x) to reschedule procedure (colpo) that was scheduled for 12/22/2022 at 9:15 with Dr. Hulan Fray.  Call pt, but could not reach the pt.  Pt's phone is off and have reached a family member.  Asked family member to have her call back.  Will send a MyChart letter.

## 2022-12-29 ENCOUNTER — Telehealth: Payer: Self-pay

## 2022-12-29 NOTE — Telephone Encounter (Signed)
[facility] contacted [facility]. They are unable to reach the pt to schedule colposcopy.  (Reference [facility] provider order for colpo on 10/07/22 pap result.)  -Try to call pt -Send certified letter to pt

## 2022-12-30 NOTE — Telephone Encounter (Signed)
Phone call to pt at [phone]. Female answered phone and stated Karen Lucero was not with her at the moment. Stated she would have Karen Lucero return the call.

## 2023-01-05 NOTE — Telephone Encounter (Signed)
[id number] Certified letter mailed to pt re [facility] not being able to reach her to schedule colpo appt.  See scanned copy.

## 2024-11-28 ENCOUNTER — Other Ambulatory Visit: Payer: Self-pay

## 2024-11-28 ENCOUNTER — Ambulatory Visit

## 2024-11-28 ENCOUNTER — Emergency Department
Admission: EM | Admit: 2024-11-28 | Discharge: 2024-11-28 | Disposition: A | Discharge status: Home / Self Care | Attending: Emergency Medicine | Admitting: Emergency Medicine

## 2024-11-28 DIAGNOSIS — R112 Nausea with vomiting, unspecified: Secondary | ICD-10-CM | POA: Insufficient documentation

## 2024-11-28 DIAGNOSIS — R5383 Other fatigue: Secondary | ICD-10-CM | POA: Diagnosis not present

## 2024-11-28 LAB — COMPREHENSIVE METABOLIC PANEL WITH GFR
ALT: 14 U/L (ref 0–44)
AST: 17 U/L (ref 15–41)
Albumin: 4.4 g/dL (ref 3.5–5.0)
Alkaline Phosphatase: 42 U/L (ref 38–126)
Anion gap: 9 (ref 5–15)
BUN: 13 mg/dL (ref 6–20)
CO2: 25 mmol/L (ref 22–32)
Calcium: 9.5 mg/dL (ref 8.9–10.3)
Chloride: 104 mmol/L (ref 98–111)
Creatinine, Ser: 0.6 mg/dL (ref 0.44–1.00)
GFR, Estimated: >60 mL/min
Glucose, Bld: 96 mg/dL (ref 70–99)
Potassium: 3.9 mmol/L (ref 3.5–5.1)
Sodium: 138 mmol/L (ref 135–145)
Total Bilirubin: 0.4 mg/dL (ref 0.0–1.2)
Total Protein: 7.2 g/dL (ref 6.5–8.1)

## 2024-11-28 LAB — CBC
HCT: 38.4 % (ref 36.0–46.0)
Hemoglobin: 12.7 g/dL (ref 12.0–15.0)
MCH: 26.3 pg (ref 26.0–34.0)
MCHC: 33.1 g/dL (ref 30.0–36.0)
MCV: 79.5 fL — ABNORMAL LOW (ref 80.0–100.0)
Platelets: 238 K/uL (ref 150–400)
RBC: 4.83 MIL/uL (ref 3.87–5.11)
RDW: 13.2 % (ref 11.5–15.5)
WBC: 8.2 K/uL (ref 4.0–10.5)
nRBC: 0 % (ref 0.0–0.2)

## 2024-11-28 LAB — LIPASE, BLOOD: Lipase: 19 U/L (ref 11–51)

## 2024-11-28 MED ORDER — ONDANSETRON 4 MG PO TBDP: 4.0000 mg | ORAL_TABLET | Freq: Three times a day (TID) | ORAL | 0 refills | Status: AC | PRN

## 2024-11-28 NOTE — ED Provider Notes (Signed)
 "  [facility] Provider Note   Event Date/Time   First MD Initiated Contact with Patient 11/28/24 1734     (approximate) History  Fatigue  HPI Naquisha Whitehair is a 24 y.o. female with a stated past medical history of iron  deficiency anemia who presents via POV from home complaining of generalized fatigue, nausea/vomiting, and tremors that began over the last 24 hours.  Patient describes an episode while driving today where she felt a warmth throughout her body as well as lightheadedness.  Patient denies having any fevers over the past 24 hours.  However she has not taken her temperature.  Patient states that she has had similar symptoms in the past when she needed a transfusion.  Patient denies any recent travel, sick contacts, or food at the ordinary. ROS: Patient currently denies any vision changes, tinnitus, difficulty speaking, facial droop, sore throat, chest pain, shortness of breath, abdominal pain, nausea/vomiting/diarrhea, dysuria, or weakness/numbness/paresthesias in any extremity   Physical Exam  Triage Vital Signs: ED Triage Vitals  Encounter Vitals Group     BP 11/28/24 1548 114/80     Girls Systolic BP Percentile --      Girls Diastolic BP Percentile --      Boys Systolic BP Percentile --      Boys Diastolic BP Percentile --      Pulse Rate 11/28/24 1548 98     Resp 11/28/24 1548 20     Temp 11/28/24 1548 98 F (36.7 C)     Temp Source 11/28/24 1548 Oral     SpO2 11/28/24 1548 95 %     Weight --      Height --      Head Circumference --      Peak Flow --      Pain Score 11/28/24 1549 2     Pain Loc --      Pain Education --      Exclude from Growth Chart --    Most recent vital signs: Vitals:   11/28/24 1548 11/28/24 1909  BP: 114/80 110/66  Pulse: 98 71  Resp: 20 18  Temp: 98 F (36.7 C)   SpO2: 95% 100%   General: Awake, oriented x4. CV:  Good peripheral perfusion. Resp:  Normal effort. Abd:  No distention. Other:  Young adult  well-developed, well-nourished African-American female resting comfortably in no acute distress ED Results / Procedures / Treatments  Labs (all labs ordered are listed, but only abnormal results are displayed) Labs Reviewed  CBC - Abnormal; Notable for the following components:      Result Value   MCV 79.5 (*)    All other components within normal limits  COMPREHENSIVE METABOLIC PANEL WITH GFR  LIPASE, BLOOD   PROCEDURES: Critical Care performed: No Procedures MEDICATIONS ORDERED IN ED: Medications - No data to display IMPRESSION / MDM / ASSESSMENT AND PLAN / ED COURSE  I reviewed the triage vital signs and the nursing notes.                             The patient is on the cardiac monitor to evaluate for evidence of arrhythmia and/or significant heart rate changes. Patient's presentation is most consistent with acute presentation with potential threat to life or bodily function. Patient is a 24 year old female with the above-stated past medical history who presents complaining of of nausea/vomiting with lightheadedness DDx: Biliary disease, pancreatitis, gastroenteritis, dehydration Plan: CBC, CMP, lipase  Laboratory evaluation patient's physical exam is not show any evidence of acute red flag symptomatology at this time.  The patient has been reexamined and is ready to be discharged.  All diagnostic results have been reviewed and discussed with the patient/family.  Care plan has been outlined and the patient/family understands all current diagnoses, results, and treatment plans.  There are no new complaints, changes, or physical findings at this time.  All questions have been addressed and answered.  Patient was instructed to, and agrees to follow-up with their primary care physician as well as return to the emergency department if any new or worsening symptoms develop.  Dispo: Discharge home with PCP follow-up   FINAL CLINICAL IMPRESSION(S) / ED DIAGNOSES   Final diagnoses:   Other fatigue  Nausea and vomiting, unspecified vomiting type   Rx / DC Orders   ED Discharge Orders          Ordered    ondansetron  (ZOFRAN -ODT) 4 MG disintegrating tablet  Every 8 hours PRN        11/28/24 1906           Note:  This document was prepared using Dragon voice recognition software and may include unintentional dictation errors.   Liliane Mallis K, MD 11/28/24 2310  "

## 2024-11-28 NOTE — ED Triage Notes (Signed)
 Pt to ED via POV from home. Pt reports fatigue and tremors for the last few days. Pt reports hx of low iron  and feels similar to when she needs a transfusion. Denies CP or SOB.

## 2024-12-05 ENCOUNTER — Ambulatory Visit: Admitting: Family Medicine

## 2024-12-05 DIAGNOSIS — Z113 Encounter for screening for infections with a predominantly sexual mode of transmission: Secondary | ICD-10-CM

## 2024-12-05 DIAGNOSIS — B9689 Other specified bacterial agents as the cause of diseases classified elsewhere: Secondary | ICD-10-CM

## 2024-12-05 LAB — HM HIV SCREENING LAB: HM HIV Screening: NEGATIVE

## 2024-12-05 MED ORDER — METRONIDAZOLE 500 MG PO TABS: 500.0000 mg | ORAL_TABLET | Freq: Two times a day (BID) | ORAL | Status: AC

## 2024-12-05 NOTE — Progress Notes (Signed)
 " [facility] Department STI clinic [address] [place] [address] Main phone: [phone]  STI screening visit  Subjective:  Karen Lucero is a 24 y.o. female being seen today for an STI screening visit. The patient reports they do have symptoms.    Patient has the following medical conditions:  Patient Active Problem List   Diagnosis Date Noted   IDA (iron  deficiency anemia) 07/15/2021   Chief Complaint  Patient presents with   SEXUALLY TRANSMITTED DISEASE    STD screening/ has Symptoms    HPI Patient reports to clinic with possible odor and BV- states she has had BV in the past- used boric acid 1 week ago. Reports she feels like her vaginal symptoms have   Reproductive considerations Patient reports they are not pregnant . They do not desire a pregnancy in the next year. Patient is currently using hormonal implant to prevent pregnancy. They reported they are not interested in discussing contraception today.    Patient's last menstrual period was 12/01/2024.  Does the patient using douching products? No  Patient's routine cervical screening is overdue  See flowsheet for further details and programmatic requirements Hyperlink available at the top of the signed note in blue.  Flow sheet content below:  Pregnancy Intention Screening Does the patient want to become pregnant in the next year?: (P) No Does the patient's partner want to become pregnant in the next year?: (P) No Would the patient like to discuss contraceptive options today?: (P) No All Patients Anyone smoke around pt and/or pt's children?: No Anyone smoke inside pt's house?: No Anyone smoke inside car?: No Anyone smoke inside the workplace?: No Reason For STD Screen STD Screening: Has symptoms Have you ever had an STD?: Yes History of Antibiotic use in the past 2 weeks?: No STD Symptoms Discharge: Yes Discharge s/s: odor and discharge Risk Factors for Hep  B Household, sexual, or needle sharing contact of a person infected with Hep B: No Sexual contact with a person who uses drugs not as prescribed?: No Currently or Ever used drugs not as prescribed: No HIV Positive: No PRep Patient: No Men who have sex with men: N/A Have Hepatitis C: No History of Incarceration: No History of Homeslessness?: No Anal sex following anal drug use?: No Risk Factors for Hep C Currently using drugs not as prescribed: No Sexual partner(s) currently using drugs as not prescribed: No History of drug use: No HIV Positive: No People with a history of incarceration: No People born between the years of 50 and 97: N/A Abuse History Has patient ever been abused physically?: No Has patient ever been abused sexually?: No Does patient feel they have a problem with Anxiety?: No Does patient feel they have a problem with Depression?: No Counseling Patient counseled to use condoms with all sex: (P) Condoms declined Test results given to patient Patient counseled to use condoms with all sex: (P) Condoms declined   Screening for MPX risk:  Unexplained rash?  No   MSM?  No   Multiple or anonymous sex partners?  No   Any close or sexual contact with a person  diagnosed with MPX?  No   Any outside the [place]  where MPX is endemic?  No   High clinical suspicion for MPX?    -Unlikely to be chickenpox    -Lymphadenopathy    -Rash that presents in same phase of       evolution on any given body part  No  Does this patient meet CDC recommendations for vaccination against MPOX? No  You already have or anticipate having the following risks:  Your sex partner has the following risks: You're traveling to a county with a clade I MPOX outbreak and anticipate these risks: Occupational exposure  You had known or suspected exposure to someone with monkeypox You had a sex partner in the past 2 weeks who was diagnosed with monkeypox You are a gay, bisexual, or other man who has  sex with men, or are transgender or nonbinary and in the past 6 months have had any of the following: - A new diagnosis of one or more sexually transmitted diseases (e.g., chlamydia, gonorrhea, or syphilis) - More than one sex partner You have had any of the following in the past 6 months: - Sex at a commercial sex venue (like a sex club or bathhouse) - Sex related to a large commercial event   or in a geographic area (city or county for example) where mpox virus transmission is occurring Sex with a new partner Sex at a commercial sex venue (e.g., a sex club or bathhouse) Sex in it consultant for money, goods, drugs, or other trade Sex in association with a large public event (e.g., a rave, party, or festival) i.e. certain people who work in a laboratory or healthcare facility   Infectious disease screenings: Vaccinated against HPV? Unknown  HIV Ever had a positive? No Last test: 2022 Results in chart:  Lab Results  Component Value Date   HMHIVSCREEN Negative - Validated 02/08/2020    Lab Results  Component Value Date   HIV Non-reactive 09/01/2021     Hep B Hep B status: unknown or no prior testing Received HBV vaccination? Unknown Received HBV testing for immunity? Unknown Results in chart:  No components found for: HMHEPBSCREEN  Do they qualify for HBV screening today? Nos Criteria:  -Household, sexual or needle sharing contact with HBV -History of drug use or homelessness -HIV positive -Those with known Hep C  Hep C Hep C status: unknown or no prior testing Results in chart:  No results found for: HMHEPCSCREEN No components found for: HEPC  Do they qualify for HCV screening today? No Criteria - since the last HCV result, does the patient have any of the following? - Current drug use - Have a partner with drug use - Has been incarcerated  Immunization history:  Immunization History  Administered Date(s) Administered   Hepatitis A, Ped/Adol-2 Dose 06/03/2007    Hepatitis B, PED/ADOLESCENT 01/05/2002, 03/01/2002, 08/29/2003   MMR 08/29/2003, 01/07/2006   Meningococcal Mcv4o 08/15/2014   Tdap 08/07/2013   Varicella 11/13/2002, 01/07/2006    The following portions of the patient's history were reviewed and updated as appropriate: allergies, current medications, past medical history, past social history, past surgical history and problem list.  Substance use screenings:  Uses tobacco products? No Uses vapes? Has in the past, but no longer uses Uses alcohol? No Uses non-injectable substances that alter your mental status? No Uses non-prescribed injectable substances? No  Objective:  There were no vitals filed for this visit.  Physical Exam Vitals and nursing note reviewed. Exam conducted with a chaperone present Ilah PEAK).  Constitutional:      Appearance: Normal appearance.  HENT:     Head: Normocephalic and atraumatic.     Comments: No nits or hair loss of scalp, brows, and lashes    Mouth/Throat:     Mouth: Mucous membranes are moist.     Pharynx: Oropharynx  is clear. No oropharyngeal exudate or posterior oropharyngeal erythema.  Eyes:     General:        Right eye: No discharge.        Left eye: No discharge.     Conjunctiva/sclera: Conjunctivae normal.     Right eye: Right conjunctiva is not injected.     Left eye: Left conjunctiva is not injected.  Pulmonary:     Effort: Pulmonary effort is normal.  Abdominal:     Tenderness: There is no abdominal tenderness. There is no rebound.  Genitourinary:    General: Normal vulva.     Exam position: Lithotomy position.     Pubic Area: No rash or pubic lice.      Labia:        Right: No rash or lesion.        Left: No rash or lesion.      Vagina: Bleeding present. No vaginal discharge, erythema or lesions.     Cervix: No cervical motion tenderness, discharge, lesion or erythema.     Uterus: Not enlarged and not tender.      Rectum: Normal.     Comments: Unable to assess pH- vaginal  bleeding Lymphadenopathy:     Cervical: No cervical adenopathy.     Upper Body:     Right upper body: No supraclavicular, axillary or epitrochlear adenopathy.     Left upper body: No supraclavicular, axillary or epitrochlear adenopathy.     Lower Body: No right inguinal adenopathy. No left inguinal adenopathy.  Skin:    General: Skin is warm and dry.     Findings: No lesion or rash.  Neurological:     Mental Status: She is alert and oriented to person, place, and time.     Assessment and Plan:  Karen Lucero is a 24 y.o. female presenting to the [facility] Department for STI screening.  Patient accepted the following screenings: vaginal CT/GC swab, vaginal wet prep, HIV, and RPR  1. Screening for venereal disease (Primary)  - Chlamydia/Gonorrhea [facility] - HIV [facility] - Syphilis Serology, [facility] - WET PREP FOR TRICH, YEAST, CLUE  2. Bacterial vaginosis -positive clue cells on wet mount -symptomatic- desires treatment   - metroNIDAZOLE  (FLAGYL ) 500 MG tablet; Take 1 tablet (500 mg total) by mouth 2 (two) times daily for 7 days.    Counseling: Discussed time line for [facility] results and that patient will be called with positive results and encouraged patient to call if they had not heard in 2 weeks.  Counseled to return or seek care for continued or worsening symptoms Recommended repeat testing in 3 months with positive results. Recommended condom use with all sex for STI prevention.   Return if symptoms worsen or fail to improve, for STI screening.  No future appointments.  Verneta Bers, [place] "

## 2024-12-05 NOTE — Progress Notes (Addendum)
 Pt is here for STD screening. Wet prep results reviewed with patient and was dispensed metronidazole  500 mg tablets 2x/day for 7 days. Pt has been counseled about the medication and its side effects. Condoms given. Kwadwo Lugene Hitt,RN.

## 2024-12-06 LAB — WET PREP FOR TRICH, YEAST, CLUE
Trichomonas Exam: NEGATIVE
Yeast Exam: NEGATIVE
